# Patient Record
Sex: Female | Born: 1970 | Race: White | Hispanic: No | Marital: Married | Smoking: Never smoker
Health system: Southern US, Community
[De-identification: ages and names within clinical notes are randomized; demographics above are authoritative.]

## PROBLEM LIST (undated history)

## (undated) DIAGNOSIS — M5412 Radiculopathy, cervical region: Secondary | ICD-10-CM

## (undated) DIAGNOSIS — K219 Gastro-esophageal reflux disease without esophagitis: Secondary | ICD-10-CM

## (undated) DIAGNOSIS — Z9889 Other specified postprocedural states: Secondary | ICD-10-CM

## (undated) DIAGNOSIS — K589 Irritable bowel syndrome without diarrhea: Secondary | ICD-10-CM

## (undated) DIAGNOSIS — K429 Umbilical hernia without obstruction or gangrene: Secondary | ICD-10-CM

## (undated) DIAGNOSIS — E559 Vitamin D deficiency, unspecified: Secondary | ICD-10-CM

## (undated) DIAGNOSIS — G43909 Migraine, unspecified, not intractable, without status migrainosus: Secondary | ICD-10-CM

## (undated) DIAGNOSIS — M1990 Unspecified osteoarthritis, unspecified site: Secondary | ICD-10-CM

## (undated) DIAGNOSIS — R011 Cardiac murmur, unspecified: Secondary | ICD-10-CM

## (undated) DIAGNOSIS — R569 Unspecified convulsions: Secondary | ICD-10-CM

## (undated) DIAGNOSIS — M5136 Other intervertebral disc degeneration, lumbar region: Secondary | ICD-10-CM

## (undated) DIAGNOSIS — J45909 Unspecified asthma, uncomplicated: Secondary | ICD-10-CM

## (undated) DIAGNOSIS — M51369 Other intervertebral disc degeneration, lumbar region without mention of lumbar back pain or lower extremity pain: Secondary | ICD-10-CM

## (undated) DIAGNOSIS — G629 Polyneuropathy, unspecified: Secondary | ICD-10-CM

## (undated) DIAGNOSIS — Z973 Presence of spectacles and contact lenses: Secondary | ICD-10-CM

## (undated) DIAGNOSIS — E119 Type 2 diabetes mellitus without complications: Secondary | ICD-10-CM

## (undated) DIAGNOSIS — Z9289 Personal history of other medical treatment: Secondary | ICD-10-CM

## (undated) DIAGNOSIS — R112 Nausea with vomiting, unspecified: Secondary | ICD-10-CM

## (undated) DIAGNOSIS — G43109 Migraine with aura, not intractable, without status migrainosus: Secondary | ICD-10-CM

## (undated) DIAGNOSIS — R413 Other amnesia: Secondary | ICD-10-CM

## (undated) DIAGNOSIS — F419 Anxiety disorder, unspecified: Secondary | ICD-10-CM

## (undated) HISTORY — DX: Unspecified osteoarthritis, unspecified site: M19.90

## (undated) HISTORY — PX: LUMBAR DISC SURGERY: SHX700

## (undated) HISTORY — DX: Umbilical hernia without obstruction or gangrene: K42.9

## (undated) HISTORY — DX: Personal history of other medical treatment: Z92.89

## (undated) HISTORY — DX: Vitamin D deficiency, unspecified: E55.9

## (undated) HISTORY — PX: ARTHROSCOPIC REPAIR ACL: SUR80

## (undated) HISTORY — DX: Other intervertebral disc degeneration, lumbar region: M51.36

## (undated) HISTORY — DX: Radiculopathy, cervical region: M54.12

## (undated) HISTORY — PX: ABDOMINOPLASTY: SUR9

## (undated) HISTORY — DX: Unspecified asthma, uncomplicated: J45.909

## (undated) HISTORY — PX: OOPHORECTOMY: SHX86

## (undated) HISTORY — DX: Cardiac murmur, unspecified: R01.1

## (undated) HISTORY — DX: Unspecified convulsions: R56.9

## (undated) HISTORY — DX: Irritable bowel syndrome, unspecified: K58.9

## (undated) HISTORY — DX: Migraine with aura, not intractable, without status migrainosus: G43.109

## (undated) HISTORY — DX: Migraine, unspecified, not intractable, without status migrainosus: G43.909

## (undated) HISTORY — DX: Other intervertebral disc degeneration, lumbar region without mention of lumbar back pain or lower extremity pain: M51.369

---
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
## 1998-01-25 HISTORY — PX: SALPINGECTOMY: SHX328

## 1998-04-02 ENCOUNTER — Other Ambulatory Visit: Admission: RE | Admit: 1998-04-02 | Discharge: 1998-04-02 | Payer: Self-pay | Admitting: Obstetrics and Gynecology

## 1998-06-04 ENCOUNTER — Other Ambulatory Visit: Admission: RE | Admit: 1998-06-04 | Discharge: 1998-06-04 | Payer: Self-pay | Admitting: Obstetrics and Gynecology

## 1998-11-12 ENCOUNTER — Other Ambulatory Visit: Admission: RE | Admit: 1998-11-12 | Discharge: 1998-11-12 | Payer: Self-pay | Admitting: Obstetrics & Gynecology

## 1998-12-18 ENCOUNTER — Encounter: Payer: Self-pay | Admitting: Emergency Medicine

## 1998-12-18 ENCOUNTER — Emergency Department (HOSPITAL_COMMUNITY): Admission: EM | Admit: 1998-12-18 | Discharge: 1998-12-18 | Payer: Self-pay | Admitting: Emergency Medicine

## 1999-03-11 ENCOUNTER — Inpatient Hospital Stay (HOSPITAL_COMMUNITY): Admission: AD | Admit: 1999-03-11 | Discharge: 1999-03-13 | Payer: Self-pay | Admitting: Obstetrics and Gynecology

## 2001-01-25 HISTORY — PX: TUBAL LIGATION: SHX77

## 2001-03-01 ENCOUNTER — Other Ambulatory Visit: Admission: RE | Admit: 2001-03-01 | Discharge: 2001-03-01 | Payer: Self-pay | Admitting: Obstetrics and Gynecology

## 2001-10-02 ENCOUNTER — Inpatient Hospital Stay (HOSPITAL_COMMUNITY): Admission: AD | Admit: 2001-10-02 | Discharge: 2001-10-02 | Payer: Self-pay | Admitting: Obstetrics & Gynecology

## 2001-10-12 ENCOUNTER — Inpatient Hospital Stay (HOSPITAL_COMMUNITY): Admission: AD | Admit: 2001-10-12 | Discharge: 2001-10-12 | Payer: Self-pay | Admitting: Obstetrics and Gynecology

## 2001-10-25 ENCOUNTER — Inpatient Hospital Stay (HOSPITAL_COMMUNITY): Admission: AD | Admit: 2001-10-25 | Discharge: 2001-10-26 | Payer: Self-pay | Admitting: Obstetrics and Gynecology

## 2002-04-27 ENCOUNTER — Other Ambulatory Visit: Admission: RE | Admit: 2002-04-27 | Discharge: 2002-04-27 | Payer: Self-pay | Admitting: Obstetrics and Gynecology

## 2002-08-27 ENCOUNTER — Encounter: Admission: RE | Admit: 2002-08-27 | Discharge: 2002-09-26 | Payer: Self-pay | Admitting: Obstetrics and Gynecology

## 2002-08-30 ENCOUNTER — Emergency Department (HOSPITAL_COMMUNITY): Admission: EM | Admit: 2002-08-30 | Discharge: 2002-08-30 | Payer: Self-pay | Admitting: Emergency Medicine

## 2004-06-22 ENCOUNTER — Emergency Department (HOSPITAL_COMMUNITY): Admission: EM | Admit: 2004-06-22 | Discharge: 2004-06-22 | Payer: Self-pay | Admitting: Emergency Medicine

## 2005-02-26 ENCOUNTER — Other Ambulatory Visit: Admission: RE | Admit: 2005-02-26 | Discharge: 2005-02-26 | Payer: Self-pay | Admitting: Obstetrics and Gynecology

## 2009-01-25 DIAGNOSIS — M5412 Radiculopathy, cervical region: Secondary | ICD-10-CM

## 2009-01-25 HISTORY — DX: Radiculopathy, cervical region: M54.12

## 2011-03-09 ENCOUNTER — Encounter (INDEPENDENT_AMBULATORY_CARE_PROVIDER_SITE_OTHER): Payer: Self-pay | Admitting: Surgery

## 2011-03-12 ENCOUNTER — Encounter (INDEPENDENT_AMBULATORY_CARE_PROVIDER_SITE_OTHER): Payer: Self-pay | Admitting: Surgery

## 2011-03-12 ENCOUNTER — Ambulatory Visit (INDEPENDENT_AMBULATORY_CARE_PROVIDER_SITE_OTHER): Payer: Federal, State, Local not specified - PPO | Admitting: Surgery

## 2011-03-12 VITALS — BP 104/68 | HR 68 | Temp 97.8°F | Resp 18 | Ht 64.0 in | Wt 171.4 lb

## 2011-03-12 DIAGNOSIS — K429 Umbilical hernia without obstruction or gangrene: Secondary | ICD-10-CM

## 2011-03-12 NOTE — Patient Instructions (Signed)
Hernia °A hernia occurs when an internal organ pushes out through a weak spot in the abdominal wall. Hernias most commonly occur in the groin and around the navel. Hernias often can be pushed back into place (reduced). Most hernias tend to get worse over time. Some abdominal hernias can get stuck in the opening (irreducible or incarcerated hernia) and cannot be reduced. An irreducible abdominal hernia which is tightly squeezed into the opening is at risk for impaired blood supply (strangulated hernia). A strangulated hernia is a medical emergency. Because of the risk for an irreducible or strangulated hernia, surgery may be recommended to repair a hernia. °CAUSES  °· Heavy lifting.  °· Prolonged coughing.  °· Straining to have a bowel movement.  °· A cut (incision) made during an abdominal surgery.  °HOME CARE INSTRUCTIONS  °· Bed rest is not required. You may continue your normal activities.  °· Avoid lifting more than 10 pounds (4.5 kg) or straining.  °· Cough gently. If you are a smoker it is best to stop. Even the best hernia repair can break down with the continual strain of coughing. Even if you do not have your hernia repaired, a cough will continue to aggravate the problem.  °· Do not wear anything tight over your hernia. Do not try to keep it in with an outside bandage or truss. These can damage abdominal contents if they are trapped within the hernia sac.  °· Eat a normal diet.  °· Avoid constipation. Straining over long periods of time will increase hernia size and encourage breakdown of repairs. If you cannot do this with diet alone, stool softeners may be used.  °SEEK IMMEDIATE MEDICAL CARE IF:  °· You have a fever.  °· You develop increasing abdominal pain.  °· You feel nauseous or vomit.  °· Your hernia is stuck outside the abdomen, looks discolored, feels hard, or is tender.  °· You have any changes in your bowel habits or in the hernia that are unusual for you.  °· You have increased pain or  swelling around the hernia.  °· You cannot push the hernia back in place by applying gentle pressure while lying down.  °MAKE SURE YOU:  °· Understand these instructions.  °· Will watch your condition.  °· Will get help right away if you are not doing well or get worse.  °Document Released: 01/11/2005 Document Revised: 09/23/2010 Document Reviewed: 08/31/2007 °[employer]® Patient Information ©2012 [employer]. °

## 2011-03-12 NOTE — Progress Notes (Signed)
Patient ID: Alison Sampson, female   DOB: 12/04/1970, 40 y.o.   MRN: [id number]  Chief Complaint  Patient presents with  . Umbilical Hernia    HPI Alison Sampson is a 40 y.o. female.   HPIPatient presents due to an umbilical hernia. She's had 2003 after tubal ligation. It is causing mild discomfort. She is scheduled for liposuction and abdominoplasty in the next few months and would like to get this fixed. The discomfort is mild. She has no nausea or vomiting.  Past Medical History  Diagnosis Date  . Arthritis   . Umbilical hernia   . Hearing loss     Past Surgical History  Procedure Date  . Tubal ligation 2003  . Arthroscopic repair acl 2010, 2011    ACL Repair x 2  . Cosmetic surgery 1990    Breast Augmentation  . Breast surgery 04/1988    Breast Implants    History reviewed. No pertinent family history.  Social History History  Substance Use Topics  . Smoking status: Never Smoker   . Smokeless tobacco: Never Used  . Alcohol Use: No    Allergies  Allergen Reactions  . Tetracyclines & Related Nausea And Vomiting    No current outpatient prescriptions on file.    Review of Systems Review of Systems  Constitutional: Negative for fever, chills and unexpected weight change.  HENT: Negative for hearing loss, congestion, sore throat, trouble swallowing and voice change.   Eyes: Negative for visual disturbance.  Respiratory: Negative for cough and wheezing.   Cardiovascular: Negative for chest pain, palpitations and leg swelling.  Gastrointestinal: Negative for nausea, vomiting, abdominal pain, diarrhea, constipation, blood in stool, abdominal distention and anal bleeding.  Genitourinary: Negative for hematuria, vaginal bleeding and difficulty urinating.  Musculoskeletal: Negative for arthralgias.  Skin: Negative for rash and wound.  Neurological: Negative for seizures, syncope and headaches.  Hematological: Negative for adenopathy. Does not bruise/bleed easily.    Psychiatric/Behavioral: Negative for confusion.    Blood pressure 104/68, pulse 68, temperature 97.8 F (36.6 C), temperature source Temporal, resp. rate 18, height 5' 4" (1.626 m), weight 171 lb 6.4 oz (77.747 kg).  Physical Exam Physical Exam  Constitutional: She is oriented to person, place, and time. She appears well-developed and well-nourished.  HENT:  Head: Normocephalic and atraumatic.  Eyes: EOM are normal. Pupils are equal, round, and reactive to light.  Neck: Normal range of motion. Neck supple.  Cardiovascular: Normal rate and regular rhythm.   Pulmonary/Chest: Effort normal and breath sounds normal.  Abdominal:    Neurological: She is alert and oriented to person, place, and time. GCS eye subscore is 4. GCS verbal subscore is 5. GCS motor subscore is 6.  Skin: Skin is warm and dry.  Psychiatric: She has a normal mood and affect. Her speech is normal and behavior is normal. Judgment and thought content normal. Cognition and memory are normal.    Data Reviewed   Assessment   umbilical hernia    Plan    Repair  umbilical hernia. The risk of hernia repair include bleeding,  Infection,   Recurrence of the hernia,  Mesh use, chronic pain,  Organ injury,  Bowel injury,  Bladder injury,   nerve injury with numbness around the incision,  Death,  and worsening of preexisting  medical problems.  The alternatives to surgery have been discussed as well..  Long term expectations of both operative and non operative treatments have been discussed.   The patient agrees to proceed.         Mayrani Khamis A. 03/12/2011, 9:55 AM    

## 2011-03-17 ENCOUNTER — Encounter (HOSPITAL_BASED_OUTPATIENT_CLINIC_OR_DEPARTMENT_OTHER): Payer: Self-pay | Admitting: *Deleted

## 2011-03-17 ENCOUNTER — Encounter (HOSPITAL_BASED_OUTPATIENT_CLINIC_OR_DEPARTMENT_OTHER)
Admission: RE | Admit: 2011-03-17 | Discharge: 2011-03-17 | Disposition: A | Discharge status: Home / Self Care | Payer: Federal, State, Local not specified - PPO | Source: Ambulatory Visit | Attending: Surgery | Admitting: Surgery

## 2011-03-19 ENCOUNTER — Other Ambulatory Visit (INDEPENDENT_AMBULATORY_CARE_PROVIDER_SITE_OTHER): Payer: Self-pay | Admitting: Surgery

## 2011-03-22 ENCOUNTER — Ambulatory Visit
Admission: RE | Admit: 2011-03-22 | Discharge: 2011-03-22 | Disposition: A | Discharge status: Home / Self Care | Payer: Federal, State, Local not specified - PPO | Source: Ambulatory Visit | Attending: Surgery | Admitting: Surgery

## 2011-03-22 ENCOUNTER — Encounter (HOSPITAL_BASED_OUTPATIENT_CLINIC_OR_DEPARTMENT_OTHER)
Admission: RE | Admit: 2011-03-22 | Discharge: 2011-03-22 | Disposition: A | Discharge status: Home / Self Care | Payer: Federal, State, Local not specified - PPO | Source: Ambulatory Visit | Attending: Surgery | Admitting: Surgery

## 2011-03-22 LAB — DIFFERENTIAL
Eosinophils Relative: 1 % (ref 0–5)
Lymphocytes Relative: 26 % (ref 12–46)
Monocytes Absolute: 0.5 10*3/uL (ref 0.1–1.0)
Monocytes Relative: 6 % (ref 3–12)
Neutro Abs: 6.4 10*3/uL (ref 1.7–7.7)

## 2011-03-22 LAB — COMPREHENSIVE METABOLIC PANEL
ALT: 13 U/L (ref 0–35)
AST: 17 U/L (ref 0–37)
Albumin: 3.9 g/dL (ref 3.5–5.2)
CO2: 27 mEq/L (ref 19–32)
Calcium: 9.7 mg/dL (ref 8.4–10.5)
Chloride: 102 mEq/L (ref 96–112)
Creatinine, Ser: 0.86 mg/dL (ref 0.50–1.10)
GFR calc non Af Amer: 83 mL/min — ABNORMAL LOW (ref >90)
Sodium: 138 mEq/L (ref 135–145)
Total Bilirubin: 0.1 mg/dL — ABNORMAL LOW (ref 0.3–1.2)

## 2011-03-22 LAB — CBC
MCV: 83.3 fL (ref 78.0–100.0)
Platelets: 304 10*3/uL (ref 150–400)
RBC: 4.48 MIL/uL (ref 3.87–5.11)
RDW: 12.2 % (ref 11.5–15.5)
WBC: 9.4 10*3/uL (ref 4.0–10.5)

## 2011-03-24 ENCOUNTER — Encounter (HOSPITAL_BASED_OUTPATIENT_CLINIC_OR_DEPARTMENT_OTHER): Payer: Self-pay | Admitting: Anesthesiology

## 2011-03-24 ENCOUNTER — Ambulatory Visit (HOSPITAL_BASED_OUTPATIENT_CLINIC_OR_DEPARTMENT_OTHER)
Admission: RE | Admit: 2011-03-24 | Discharge: 2011-03-24 | Disposition: A | Discharge status: Home / Self Care | Payer: Federal, State, Local not specified - PPO | Source: Ambulatory Visit | Attending: Surgery | Admitting: Surgery

## 2011-03-24 ENCOUNTER — Encounter (HOSPITAL_BASED_OUTPATIENT_CLINIC_OR_DEPARTMENT_OTHER): Payer: Self-pay | Admitting: *Deleted

## 2011-03-24 ENCOUNTER — Ambulatory Visit (HOSPITAL_BASED_OUTPATIENT_CLINIC_OR_DEPARTMENT_OTHER): Payer: Federal, State, Local not specified - PPO | Admitting: Anesthesiology

## 2011-03-24 ENCOUNTER — Encounter (HOSPITAL_BASED_OUTPATIENT_CLINIC_OR_DEPARTMENT_OTHER)
Admission: RE | Disposition: A | Discharge status: Home / Self Care | Payer: Self-pay | Source: Ambulatory Visit | Attending: Surgery

## 2011-03-24 ENCOUNTER — Encounter (HOSPITAL_BASED_OUTPATIENT_CLINIC_OR_DEPARTMENT_OTHER): Payer: Self-pay | Admitting: Surgery

## 2011-03-24 DIAGNOSIS — Z01812 Encounter for preprocedural laboratory examination: Secondary | ICD-10-CM | POA: Insufficient documentation

## 2011-03-24 DIAGNOSIS — K429 Umbilical hernia without obstruction or gangrene: Secondary | ICD-10-CM

## 2011-03-24 DIAGNOSIS — K219 Gastro-esophageal reflux disease without esophagitis: Secondary | ICD-10-CM | POA: Insufficient documentation

## 2011-03-24 HISTORY — PX: UMBILICAL HERNIA REPAIR: SHX196

## 2011-03-24 HISTORY — DX: Other specified postprocedural states: Z98.890

## 2011-03-24 HISTORY — DX: Gastro-esophageal reflux disease without esophagitis: K21.9

## 2011-03-24 HISTORY — DX: Nausea with vomiting, unspecified: R11.2

## 2011-03-24 SURGERY — REPAIR, HERNIA, UMBILICAL, ADULT
Anesthesia: General | Site: Abdomen | Wound class: Clean

## 2011-03-24 MED ORDER — CEFAZOLIN SODIUM 1-5 GM-% IV SOLN
1.0000 g | INTRAVENOUS | Status: AC
  Administered 2011-03-24: 1 g via INTRAVENOUS

## 2011-03-24 MED ORDER — FENTANYL CITRATE 0.05 MG/ML IJ SOLN
INTRAMUSCULAR | Status: DC | PRN
  Administered 2011-03-24: 100 ug via INTRAVENOUS

## 2011-03-24 MED ORDER — METOCLOPRAMIDE HCL 5 MG/ML IJ SOLN: 10.0000 mg | Freq: Once | INTRAMUSCULAR | Status: DC | PRN

## 2011-03-24 MED ORDER — HYDROCODONE-ACETAMINOPHEN 5-325 MG PO TABS: 1.0000 | ORAL_TABLET | Freq: Four times a day (QID) | ORAL | Status: AC | PRN

## 2011-03-24 MED ORDER — LACTATED RINGERS IV SOLN
INTRAVENOUS | Status: DC
  Administered 2011-03-24: 10:00:00 via INTRAVENOUS
  Administered 2011-03-24: 20 mL via INTRAVENOUS

## 2011-03-24 MED ORDER — PROPOFOL 10 MG/ML IV EMUL
INTRAVENOUS | Status: DC | PRN
  Administered 2011-03-24: 250 mg via INTRAVENOUS

## 2011-03-24 MED ORDER — BUPIVACAINE-EPINEPHRINE 0.25% -1:200000 IJ SOLN
INTRAMUSCULAR | Status: DC | PRN
  Administered 2011-03-24: 10 mL

## 2011-03-24 MED ORDER — SUCCINYLCHOLINE CHLORIDE 20 MG/ML IJ SOLN
INTRAMUSCULAR | Status: DC | PRN
  Administered 2011-03-24: 120 mg via INTRAVENOUS

## 2011-03-24 MED ORDER — MIDAZOLAM HCL 5 MG/5ML IJ SOLN
INTRAMUSCULAR | Status: DC | PRN
  Administered 2011-03-24: 2 mg via INTRAVENOUS

## 2011-03-24 MED ORDER — ONDANSETRON HCL 4 MG/2ML IJ SOLN
INTRAMUSCULAR | Status: DC | PRN
  Administered 2011-03-24: 4 mg via INTRAVENOUS

## 2011-03-24 MED ORDER — ACETAMINOPHEN 10 MG/ML IV SOLN
1000.0000 mg | Freq: Once | INTRAVENOUS | Status: AC
  Administered 2011-03-24: 1000 mg via INTRAVENOUS

## 2011-03-24 MED ORDER — MORPHINE SULFATE 10 MG/ML IJ SOLN: 0.0500 mg/kg | INTRAMUSCULAR | Status: DC | PRN

## 2011-03-24 MED ORDER — METOCLOPRAMIDE HCL 5 MG/ML IJ SOLN
INTRAMUSCULAR | Status: DC | PRN
  Administered 2011-03-24: 10 mg via INTRAVENOUS

## 2011-03-24 MED ORDER — DEXAMETHASONE SODIUM PHOSPHATE 4 MG/ML IJ SOLN
INTRAMUSCULAR | Status: DC | PRN
  Administered 2011-03-24: 6 mg via INTRAVENOUS

## 2011-03-24 MED ORDER — MIDAZOLAM HCL 2 MG/2ML IJ SOLN: 0.5000 mg | INTRAMUSCULAR | Status: DC | PRN

## 2011-03-24 MED ORDER — HYDROMORPHONE HCL PF 1 MG/ML IJ SOLN: 0.2500 mg | INTRAMUSCULAR | Status: DC | PRN

## 2011-03-24 MED ORDER — LIDOCAINE HCL (CARDIAC) 20 MG/ML IV SOLN
INTRAVENOUS | Status: DC | PRN
  Administered 2011-03-24: 60 mg via INTRAVENOUS

## 2011-03-24 SURGICAL SUPPLY — 55 items
ADH SKN CLS APL DERMABOND .7 (GAUZE/BANDAGES/DRESSINGS) ×2
APL SKNCLS STERI-STRIP NONHPOA (GAUZE/BANDAGES/DRESSINGS)
BENZOIN TINCTURE PRP APPL 2/3 (GAUZE/BANDAGES/DRESSINGS) IMPLANT
BLADE SURG 10 STRL SS (BLADE) ×3 IMPLANT
BLADE SURG 15 STRL LF DISP TIS (BLADE) ×2 IMPLANT
BLADE SURG 15 STRL SS (BLADE) ×3
BLADE SURG ROTATE 9660 (MISCELLANEOUS) IMPLANT
CANISTER SUCTION 1200CC (MISCELLANEOUS) ×3 IMPLANT
CHLORAPREP W/TINT 26ML (MISCELLANEOUS) ×3 IMPLANT
CLEANER CAUTERY TIP 5X5 PAD (MISCELLANEOUS) ×2 IMPLANT
CLOTH BEACON ORANGE TIMEOUT ST (SAFETY) ×3 IMPLANT
COVER MAYO STAND STRL (DRAPES) ×3 IMPLANT
COVER TABLE BACK 60X90 (DRAPES) ×3 IMPLANT
DECANTER SPIKE VIAL GLASS SM (MISCELLANEOUS) IMPLANT
DERMABOND ADVANCED (GAUZE/BANDAGES/DRESSINGS) ×1
DERMABOND ADVANCED .7 DNX12 (GAUZE/BANDAGES/DRESSINGS) ×2 IMPLANT
DRAPE LAPAROTOMY TRNSV 102X78 (DRAPE) ×3 IMPLANT
DRAPE UTILITY XL STRL (DRAPES) ×3 IMPLANT
DRSG TEGADERM 4X4.75 (GAUZE/BANDAGES/DRESSINGS) IMPLANT
ELECT REM PT RETURN 9FT ADLT (ELECTROSURGICAL) ×3
ELECTRODE REM PT RTRN 9FT ADLT (ELECTROSURGICAL) ×2 IMPLANT
GLOVE BIOGEL PI IND STRL 7.0 (GLOVE) ×1 IMPLANT
GLOVE BIOGEL PI IND STRL 8 (GLOVE) ×3 IMPLANT
GLOVE BIOGEL PI INDICATOR 7.0 (GLOVE) ×1
GLOVE BIOGEL PI INDICATOR 8 (GLOVE) ×2
GLOVE ECLIPSE 8.0 STRL XLNG CF (GLOVE) ×3 IMPLANT
GLOVE SURG SS PI 7.5 STRL IVOR (GLOVE) ×2 IMPLANT
GOWN PREVENTION PLUS XLARGE (GOWN DISPOSABLE) ×5 IMPLANT
GOWN PREVENTION PLUS XXLARGE (GOWN DISPOSABLE) ×3 IMPLANT
NDL HYPO 25X1 1.5 SAFETY (NEEDLE) ×1 IMPLANT
NEEDLE HYPO 22GX1.5 SAFETY (NEEDLE) IMPLANT
NEEDLE HYPO 25X1 1.5 SAFETY (NEEDLE) ×3 IMPLANT
NS IRRIG 1000ML POUR BTL (IV SOLUTION) ×2 IMPLANT
PACK BASIN DAY SURGERY FS (CUSTOM PROCEDURE TRAY) ×3 IMPLANT
PAD CLEANER CAUTERY TIP 5X5 (MISCELLANEOUS) ×1
PENCIL BUTTON HOLSTER BLD 10FT (ELECTRODE) ×3 IMPLANT
SLEEVE SCD COMPRESS KNEE MED (MISCELLANEOUS) ×3 IMPLANT
STAPLER VISISTAT 35W (STAPLE) IMPLANT
STRIP CLOSURE SKIN 1/2X4 (GAUZE/BANDAGES/DRESSINGS) IMPLANT
SUT MON AB 4-0 PC3 18 (SUTURE) ×3 IMPLANT
SUT NOVA NAB DX-16 0-1 5-0 T12 (SUTURE) ×2 IMPLANT
SUT NOVA NAB GS-22 2 0 T19 (SUTURE) IMPLANT
SUT PROLENE 0 CT 1 30 (SUTURE) IMPLANT
SUT SILK 3 0 SH 30 (SUTURE) IMPLANT
SUT VIC AB 2-0 SH 27 (SUTURE) ×3
SUT VIC AB 2-0 SH 27XBRD (SUTURE) ×2 IMPLANT
SUT VIC AB 3-0 SH 27 (SUTURE) ×3
SUT VIC AB 3-0 SH 27X BRD (SUTURE) ×1 IMPLANT
SYR 3ML 18GX1 1/2 (SYRINGE) ×2 IMPLANT
SYR CONTROL 10ML LL (SYRINGE) ×3 IMPLANT
TOWEL OR 17X24 6PK STRL BLUE (TOWEL DISPOSABLE) ×3 IMPLANT
TOWEL OR NON WOVEN STRL DISP B (DISPOSABLE) ×3 IMPLANT
TUBE CONNECTING 20X1/4 (TUBING) ×3 IMPLANT
WATER STERILE IRR 1000ML POUR (IV SOLUTION) ×1 IMPLANT
YANKAUER SUCT BULB TIP NO VENT (SUCTIONS) ×3 IMPLANT

## 2011-03-24 NOTE — Anesthesia Preprocedure Evaluation (Signed)
Anesthesia Evaluation  Patient identified by MRN, date of birth, ID band Patient awake    Reviewed: Allergy & Precautions, H&P , NPO status , Patient's Chart, lab work & pertinent test results, reviewed documented beta blocker date and time   History of Anesthesia Complications (+) PONV  Airway Mallampati: II TM Distance: >3 FB Neck ROM: full    Dental   Pulmonary neg pulmonary ROS,          Cardiovascular neg cardio ROS     Neuro/Psych Negative Neurological ROS  Negative Psych ROS   GI/Hepatic Neg liver ROS, GERD-  Medicated and Controlled,  Endo/Other  Negative Endocrine ROS  Renal/GU negative Renal ROS  Genitourinary negative   Musculoskeletal   Abdominal   Peds  Hematology negative hematology ROS (+)   Anesthesia Other Findings See surgeon's H&P   Reproductive/Obstetrics negative OB ROS                           Anesthesia Physical Anesthesia Plan  ASA: II  Anesthesia Plan: General   Post-op Pain Management:    Induction: Intravenous  Airway Management Planned: LMA  Additional Equipment:   Intra-op Plan:   Post-operative Plan: Extubation in OR  Informed Consent: I have reviewed the patients History and Physical, chart, labs and discussed the procedure including the risks, benefits and alternatives for the proposed anesthesia with the patient or authorized representative who has indicated his/her understanding and acceptance.     Plan Discussed with: CRNA and Surgeon  Anesthesia Plan Comments:         Anesthesia Quick Evaluation  

## 2011-03-24 NOTE — Anesthesia Postprocedure Evaluation (Signed)
Anesthesia Post Note  Patient: Alison Sampson  Procedure(s) Performed: Procedure(s) (LRB): HERNIA REPAIR UMBILICAL ADULT (N/A)  Anesthesia type: General  Patient location: PACU  Post pain: Pain level controlled  Post assessment: Patient's Cardiovascular Status Stable  Last Vitals:  Filed Vitals:   03/24/11 1215  BP: 119/68  Pulse: 104  Temp:   Resp: 14    Post vital signs: Reviewed and stable  Level of consciousness: alert  Complications: No apparent anesthesia complications

## 2011-03-24 NOTE — Op Note (Signed)
Preop diagnosis: Umbilical hernia  Postop diagnosis: Same  Procedure: Umbilical hernia repair  Surgeon: Harriette Bouillon M.D.  Anesthesia: Gen. with 0.25% Sensorcaine with epinephrine  EBL: Less than 10 cc  Specimen: None  Drains: None  IV fluids: 1000 cc crystalloid  Indications for procedure: The patient is a 41 year old female the small umbilical hernia is causing discomfort. She wishes to have it repaired. We discussed surgical options and non-surgical options.The risk of hernia repair include bleeding,  Infection,   Recurrence of the hernia,  Mesh use, chronic pain,  Organ injury,  Bowel injury,  Bladder injury,   nerve injury with numbness around the incision,  Death,  and worsening of preexisting  medical problems.  The alternatives to surgery have been discussed as well..  Long term expectations of both operative and non operative treatments have been discussed.   The patient agrees to proceed.  Description of procedure: The patient was met in the holding area and questions were answered. She was brought back to the operating room and placed supine. After induction of general anesthesia, the abdomen was prepped and draped in a sterile fashion. Timeout was done. Local anesthesia was injected around the umbilicus. Curvilinear incision was made along the inferior aspect of the umbilicus. Dissection was carried down and the umbilicus was dissected off the hernia sac. A 1 cm hernia was identified. The pre-peritoneal fat was placed back in the peritoneal cavity the defect was closed with #1 Novafil suture. Balloon was irrigated. The umbilicus was attached to the fascia with 3-0 Vicryl. 4 Monocryl was used to close the skin a subcuticular fashion. Dermabond was applied. All final counts sponge, needles and instruments are found to be correct. The patient was awoke taken to recovery in satisfactory condition.

## 2011-03-24 NOTE — H&P (View-Only) (Signed)
Patient ID: Alison Sampson, female   DOB: [birth year], 41 y.o.   MRN: [id number]  Chief Complaint  Patient presents with  . Umbilical Hernia    HPI Alison Sampson is Sampson 41 y.o. female.   HPIPatient presents due to an umbilical hernia. She's had 2003 after tubal ligation. It is causing mild discomfort. She is scheduled for liposuction and abdominoplasty in the next few months and would like to get this fixed. The discomfort is mild. She has no nausea or vomiting.  Past Medical History  Diagnosis Date  . Arthritis   . Umbilical hernia   . Hearing loss     Past Surgical History  Procedure Date  . Tubal ligation 2003  . Arthroscopic repair acl 2010, 2011    ACL Repair x 2  . Cosmetic surgery 1990    Breast Augmentation  . Breast surgery 04/1988    Breast Implants    History reviewed. No pertinent family history.  Social History History  Substance Use Topics  . Smoking status: Never Smoker   . Smokeless tobacco: Never Used  . Alcohol Use: No    Allergies  Allergen Reactions  . Tetracyclines & Related Nausea And Vomiting    No current outpatient prescriptions on file.    Review of Systems Review of Systems  Constitutional: Negative for fever, chills and unexpected weight change.  HENT: Negative for hearing loss, congestion, sore throat, trouble swallowing and voice change.   Eyes: Negative for visual disturbance.  Respiratory: Negative for cough and wheezing.   Cardiovascular: Negative for chest pain, palpitations and leg swelling.  Gastrointestinal: Negative for nausea, vomiting, abdominal pain, diarrhea, constipation, blood in stool, abdominal distention and anal bleeding.  Genitourinary: Negative for hematuria, vaginal bleeding and difficulty urinating.  Musculoskeletal: Negative for arthralgias.  Skin: Negative for rash and wound.  Neurological: Negative for seizures, syncope and headaches.  Hematological: Negative for adenopathy. Does not bruise/bleed easily.    Psychiatric/Behavioral: Negative for confusion.    Blood pressure 104/68, pulse 68, temperature 97.8 F (36.6 C), temperature source Temporal, resp. rate 18, height 5\' 4"  (1.626 m), weight 171 lb 6.4 oz (77.747 kg).  Physical Exam Physical Exam  Constitutional: She is oriented to person, place, and time. She appears well-developed and well-nourished.  HENT:  Head: Normocephalic and atraumatic.  Eyes: EOM are normal. Pupils are equal, round, and reactive to light.  Neck: Normal range of motion. Neck supple.  Cardiovascular: Normal rate and regular rhythm.   Pulmonary/Chest: Effort normal and breath sounds normal.  Abdominal:    Neurological: She is alert and oriented to person, place, and time. GCS eye subscore is 4. GCS verbal subscore is 5. GCS motor subscore is 6.  Skin: Skin is warm and dry.  Psychiatric: She has Sampson normal mood and affect. Her speech is normal and behavior is normal. Judgment and thought content normal. Cognition and memory are normal.    Data Reviewed   Assessment   umbilical hernia    Plan    Repair  umbilical hernia. The risk of hernia repair include bleeding,  Infection,   Recurrence of the hernia,  Mesh use, chronic pain,  Organ injury,  Bowel injury,  Bladder injury,   nerve injury with numbness around the incision,  Death,  and worsening of preexisting  medical problems.  The alternatives to surgery have been discussed as well..  Long term expectations of both operative and non operative treatments have been discussed.   The patient agrees to proceed.  Alison Sampson. 03/12/2011, 9:55 AM

## 2011-03-24 NOTE — Discharge Instructions (Signed)
[facility] _____[facility]  UMBILICAL OR INGUINAL HERNIA REPAIR: POST OP INSTRUCTIONS  Always review your discharge instruction sheet given to you by the facility where your surgery was performed. IF YOU HAVE DISABILITY OR FAMILY LEAVE FORMS, YOU MUST BRING THEM TO THE OFFICE FOR PROCESSING.   DO NOT GIVE THEM TO YOUR DOCTOR.  1. A  prescription for pain medication may be given to you upon discharge.  Take your pain medication as prescribed, if needed.  If narcotic pain medicine is not needed, then you may take acetaminophen (Tylenol) or ibuprofen (Advil) as needed. 2. Take your usually prescribed medications unless otherwise directed. 3. If you need a refill on your pain medication, please contact your pharmacy.  They will contact our office to request authorization. Prescriptions will not be filled after 5 pm or on week-ends. 4. You should follow a light diet the first 24 hours after arrival home, such as soup and crackers, etc.  Be sure to include lots of fluids daily.  Resume your normal diet the day after surgery. 5. Most patients will experience some swelling and bruising around the umbilicus or in the groin and scrotum.  Ice packs and reclining will help.  Swelling and bruising can take several days to resolve.  6. It is common to experience some constipation if taking pain medication after surgery.  Increasing fluid intake and taking a stool softener (such as Colace) will usually help or prevent this problem from occurring.  A mild laxative (Milk of Magnesia or Miralax) should be taken according to package directions if there are no bowel movements after 48 hours. 7. Unless discharge instructions indicate otherwise, you may remove your bandages 24-48 hours after surgery, and you may shower at that time.  You may have steri-strips (small skin tapes) in place directly over the incision.  These strips should be left on the skin for 7-10 days.  If your surgeon used skin glue on the  incision, you may shower in 24 hours.  The glue will flake off over the next 2-3 weeks.  Any sutures or staples will be removed at the office during your follow-up visit. 8. ACTIVITIES:  You may resume regular (light) daily activities beginning the next day--such as daily self-care, walking, climbing stairs--gradually increasing activities as tolerated.  You may have sexual intercourse when it is comfortable.  Refrain from any heavy lifting or straining until approved by your doctor. a. You may drive when you are no longer taking prescription pain medication, you can comfortably wear a seatbelt, and you can safely maneuver your car and apply brakes. b. RETURN TO WORK:  __________________________________________________________ 9. You should see your doctor in the office for a follow-up appointment approximately 2-3 weeks after your surgery.  Make sure that you call for this appointment within a day or two after you arrive home to insure a convenient appointment time. 10. OTHER INSTRUCTIONS:  __________________________________________________________________________________________________________________________________________________________________________________________  WHEN TO CALL YOUR DOCTOR: 1. Fever over 101.0 2. Inability to urinate 3. Nausea and/or vomiting 4. Extreme swelling or bruising 5. Continued bleeding from incision. 6. Increased pain, redness, or drainage from the incision  The clinic staff is available to answer your questions during regular business hours.  Please don't hesitate to call and ask to speak to one of the nurses for clinical concerns.  If you have a medical emergency, go to the nearest emergency room or call 911.  A surgeon from [facility] is always on call at the hospital     [address], [place], [place]  [address] ?  [address] [address], [place], [place]   [address] [phone] ? [phone] ? FAX [phone] Web site:  [email or web address]   [facility]  [address] [place], [place] [address] [phone]   Post Anesthesia Home Care Instructions  Activity: Get plenty of rest for the remainder of the day. A responsible adult should stay with you for 24 hours following the procedure.  For the next 24 hours, DO NOT: -Drive a car -Advertising copywriter -Drink alcoholic beverages -Take any medication unless instructed by your physician -Make any legal decisions or sign important papers.  Meals: Start with liquid foods such as gelatin or soup. Progress to regular foods as tolerated. Avoid greasy, spicy, heavy foods. If nausea and/or vomiting occur, drink only clear liquids until the nausea and/or vomiting subsides. Call your physician if vomiting continues.  Special Instructions/Symptoms: Your throat may feel dry or sore from the anesthesia or the breathing tube placed in your throat during surgery. If this causes discomfort, gargle with warm salt water. The discomfort should disappear within 24 hours.

## 2011-03-24 NOTE — Anesthesia Procedure Notes (Signed)
Procedure Name: Intubation Date/Time: 03/24/2011 10:51 AM Performed by: Radford Pax Pre-anesthesia Checklist: Patient identified, Emergency Drugs available, Suction available, Patient being monitored and Timeout performed Patient Re-evaluated:Patient Re-evaluated prior to inductionOxygen Delivery Method: Circle System Utilized Preoxygenation: Pre-oxygenation with 100% oxygen Intubation Type: IV induction Ventilation: Mask ventilation without difficulty Laryngoscope Size: Miller and 2 Grade View: Grade I Tube type: Oral Tube size: 7.0 mm Number of attempts: 1 (cords open and clear, atraumatic) Airway Equipment and Method: oral airway Placement Confirmation: ETT inserted through vocal cords under direct vision,  positive ETCO2 and breath sounds checked- equal and bilateral Tube secured with: Tape (pink tape used) Dental Injury: Teeth and Oropharynx as per pre-operative assessment

## 2011-03-24 NOTE — Transfer of Care (Signed)
Immediate Anesthesia Transfer of Care Note  Patient: Alison Sampson  Procedure(s) Performed: Procedure(s) (LRB): HERNIA REPAIR UMBILICAL ADULT (N/A)  Patient Location: PACU  Anesthesia Type: General  Level of Consciousness: awake, alert , oriented and patient cooperative  Airway & Oxygen Therapy: Patient Spontanous Breathing and Patient connected to face mask oxygen  Post-op Assessment: Report given to PACU RN and Post -op Vital signs reviewed and stable  Post vital signs: Reviewed and stable  Complications: No apparent anesthesia complications

## 2011-03-24 NOTE — Interval H&P Note (Signed)
History and Physical Interval Note:  03/24/2011 10:31 AM  Alison Sampson  has presented today for surgery, with the diagnosis of umbilical hernia  The various methods of treatment have been discussed with the patient and family. After consideration of risks, benefits and other options for treatment, the patient has consented to  Procedure(s) (LRB): HERNIA REPAIR UMBILICAL ADULT (N/A) INSERTION OF MESH (N/A) as a surgical intervention .  The patients' history has been reviewed, patient examined, no change in status, stable for surgery.  I have reviewed the patients' chart and labs.  Questions were answered to the patient's satisfaction.     [name]

## 2011-03-25 ENCOUNTER — Encounter (HOSPITAL_BASED_OUTPATIENT_CLINIC_OR_DEPARTMENT_OTHER): Payer: Self-pay | Admitting: Surgery

## 2011-04-15 ENCOUNTER — Encounter (INDEPENDENT_AMBULATORY_CARE_PROVIDER_SITE_OTHER): Payer: Self-pay | Admitting: Surgery

## 2011-04-15 ENCOUNTER — Encounter (INDEPENDENT_AMBULATORY_CARE_PROVIDER_SITE_OTHER): Payer: Federal, State, Local not specified - PPO | Admitting: Surgery

## 2011-04-15 ENCOUNTER — Ambulatory Visit (INDEPENDENT_AMBULATORY_CARE_PROVIDER_SITE_OTHER): Payer: Federal, State, Local not specified - PPO | Admitting: Surgery

## 2011-04-15 VITALS — BP 118/78 | HR 60 | Temp 97.6°F | Resp 12 | Ht 64.0 in | Wt 169.8 lb

## 2011-04-15 DIAGNOSIS — Z9889 Other specified postprocedural states: Secondary | ICD-10-CM

## 2011-04-15 NOTE — Progress Notes (Signed)
Patient returns after umbilical hernia repair. She is doing well.  Exam: Umbilical incision healing well. No signs of infection.  Impression status post repair of the hernia without mesh  Plan: Return to full activity. Follow up as needed. I proceeded with plastic surgery as planned.

## 2011-04-15 NOTE — Patient Instructions (Signed)
Return to full activity.  May proceed with plastic surgery.

## 2012-01-26 DIAGNOSIS — I469 Cardiac arrest, cause unspecified: Secondary | ICD-10-CM

## 2012-01-26 HISTORY — PX: REDUCTION MAMMAPLASTY: SUR839

## 2012-01-26 HISTORY — PX: ABDOMINOPLASTY: SUR9

## 2012-01-26 HISTORY — DX: Cardiac arrest, cause unspecified: I46.9

## 2012-06-07 ENCOUNTER — Other Ambulatory Visit: Payer: Self-pay | Admitting: Dermatology

## 2012-08-22 ENCOUNTER — Telehealth: Payer: Self-pay | Admitting: Neurology

## 2012-08-22 NOTE — Telephone Encounter (Signed)
Please assign patient, former [facility] patient to be scheduled. Last seen 02-16-10.

## 2012-08-22 NOTE — Telephone Encounter (Signed)
Sent to scheduler.  

## 2012-12-14 ENCOUNTER — Ambulatory Visit: Payer: Self-pay | Admitting: Neurology

## 2013-10-11 DIAGNOSIS — F419 Anxiety disorder, unspecified: Secondary | ICD-10-CM | POA: Insufficient documentation

## 2013-10-11 DIAGNOSIS — F329 Major depressive disorder, single episode, unspecified: Secondary | ICD-10-CM | POA: Insufficient documentation

## 2013-10-11 DIAGNOSIS — F32A Depression, unspecified: Secondary | ICD-10-CM | POA: Insufficient documentation

## 2013-12-14 ENCOUNTER — Other Ambulatory Visit: Payer: Self-pay | Admitting: Rehabilitation

## 2013-12-14 DIAGNOSIS — M5412 Radiculopathy, cervical region: Secondary | ICD-10-CM

## 2013-12-23 ENCOUNTER — Ambulatory Visit
Admission: RE | Admit: 2013-12-23 | Discharge: 2013-12-23 | Disposition: A | Discharge status: Home / Self Care | Payer: Federal, State, Local not specified - PPO | Source: Ambulatory Visit | Attending: Rehabilitation | Admitting: Rehabilitation

## 2013-12-23 DIAGNOSIS — M5412 Radiculopathy, cervical region: Secondary | ICD-10-CM

## 2014-01-21 ENCOUNTER — Other Ambulatory Visit: Payer: Self-pay | Admitting: Rehabilitation

## 2014-01-21 DIAGNOSIS — M7918 Myalgia, other site: Secondary | ICD-10-CM | POA: Insufficient documentation

## 2014-01-21 DIAGNOSIS — G930 Cerebral cysts: Secondary | ICD-10-CM

## 2014-01-27 ENCOUNTER — Inpatient Hospital Stay: Admission: RE | Admit: 2014-01-27 | Payer: Federal, State, Local not specified - PPO | Source: Ambulatory Visit

## 2014-02-03 ENCOUNTER — Ambulatory Visit
Admission: RE | Admit: 2014-02-03 | Discharge: 2014-02-03 | Disposition: A | Discharge status: Home / Self Care | Payer: Federal, State, Local not specified - PPO | Source: Ambulatory Visit | Attending: Rehabilitation | Admitting: Rehabilitation

## 2014-02-03 DIAGNOSIS — G930 Cerebral cysts: Secondary | ICD-10-CM

## 2014-02-03 MED ORDER — GADOBENATE DIMEGLUMINE 529 MG/ML IV SOLN
13.0000 mL | Freq: Once | INTRAVENOUS | Status: AC | PRN
  Administered 2014-02-03: 13 mL via INTRAVENOUS

## 2014-05-09 ENCOUNTER — Ambulatory Visit (INDEPENDENT_AMBULATORY_CARE_PROVIDER_SITE_OTHER): Payer: Federal, State, Local not specified - PPO | Admitting: Neurology

## 2014-05-09 ENCOUNTER — Encounter: Payer: Self-pay | Admitting: Neurology

## 2014-05-09 VITALS — BP 109/76 | HR 67 | Ht 64.0 in | Wt 144.2 lb

## 2014-05-09 DIAGNOSIS — G43109 Migraine with aura, not intractable, without status migrainosus: Secondary | ICD-10-CM | POA: Diagnosis not present

## 2014-05-09 HISTORY — DX: Migraine with aura, not intractable, without status migrainosus: G43.109

## 2014-05-09 MED ORDER — TOPIRAMATE 25 MG PO TABS: ORAL_TABLET | ORAL | Status: DC

## 2014-05-09 MED ORDER — NARATRIPTAN HCL 2.5 MG PO TABS: 2.5000 mg | ORAL_TABLET | Freq: Two times a day (BID) | ORAL | Status: DC | PRN

## 2014-05-09 NOTE — Progress Notes (Signed)
Reason for visit: Migraine headache  Referring physician: No primary care physician  Alison Sampson is a 44 y.o. female  History of present illness:  Alison Sampson is a 44 year old right-handed white female with a history of migraine headaches. The patient was seen over 4 years ago by Dr. Erling Cruz with a two-year history at that time of headache. The patient indicates that the headaches include the entire head, associated with a throbbing sensation. The patient may develop some left-sided facial numbness, dizziness, and some nausea. The patient feels weak all over with the headache. She indicates that the headaches have become more frequent over time, and she is now having 10-15 headache days a month. At least 4 days of the month she is missing work. The patient works out of the home, but she finds that she is completely unable to function at times. She reports lights in the vision, with a tunnel vision associated with the headache. The patient may have some neck discomfort, but she has chronic cervical spondylosis. She has found that excessive caffeine intake and alcohol may bring on the headache. A disrupted sleep schedule, and certain odors such as perfumes may bring on the headache. The headache may occur just prior to the onset of her menstrual cycle. She has been on low-dose Topamax taking 25 mg at night. She has noted that hydrocodone will worsen the headache at times. She denies any family history of headache but she does not know much about her mother's side of the family. The headaches last 2-4 days. The patient does report photophobia and phonophobia with the headache.  Past Medical History  Diagnosis Date  . Arthritis   . Umbilical hernia   . Hearing loss   . PONV (postoperative nausea and vomiting)   . GERD (gastroesophageal reflux disease)   . Classic migraine 05/09/2014    Past Surgical History  Procedure Laterality Date  . Tubal ligation  2003  . Arthroscopic repair acl  2010, 2011      ACL Repair x 2  . Cosmetic surgery  1990    Breast Augmentation  . Breast surgery  04/1988    Breast Implants  . Back surgery    . Diagnostic laparoscopy      tubal ligation  . Umbilical hernia repair  03/24/2011    Procedure: HERNIA REPAIR UMBILICAL ADULT;  Surgeon: Joyice Faster. Cornett, MD;  Location: [facility];  Service: General;  Laterality: N/A;  umbilicus    Family History  Problem Relation Age of Onset  . Cancer Maternal Grandmother     breast and ovarian  . Heart disease Maternal Grandmother   . Cancer Paternal Grandmother     lung  . Healthy Brother   . Migraines Neg Hx     Social history:  reports that she has never smoked. She has never used smokeless tobacco. She reports that she does not drink alcohol or use illicit drugs.  Medications:  Prior to Admission medications   Medication Sig Start Date End Date Taking? Authorizing Provider  carisoprodol (SOMA) 350 MG tablet Take 350 mg by mouth daily as needed. 01/11/14  Yes Historical Provider, MD  topiramate (TOPAMAX) 25 MG tablet Take 25 mg by mouth at bedtime. 03/26/14   Historical Provider, MD      Allergies  Allergen Reactions  . Tetracyclines & Related Nausea And Vomiting    ROS:  Out of a complete 14 system review of symptoms, the patient complains only of the following  symptoms, and all other reviewed systems are negative.  Light sensitivity Nausea, vomiting Joint pain, back pain, neck pain Headache, numbness, speech difficulty  Blood pressure 109/76, pulse 67, height 5\' 4"  (1.626 m), weight 144 lb 3.2 oz (65.409 kg).  Physical Exam  General: The patient is alert and cooperative at the time of the examination.  Eyes: Pupils are equal, round, and reactive to light. Discs are flat bilaterally.  Neck: The neck is supple, no carotid bruits are noted.  Respiratory: The respiratory examination is clear.  Cardiovascular: The cardiovascular examination reveals a regular rate and rhythm,  no obvious murmurs or rubs are noted.  Neuromuscular: Range of movement of the cervical spine is full with turning to the right, the patient lacks about 25 turn the head to the left. No crepitus is noted in the temporomandibular joints.  Skin: Extremities are without significant edema.  Neurologic Exam  Mental status: The patient is alert and oriented x 3 at the time of the examination. The patient has apparent normal recent and remote memory, with an apparently normal attention span and concentration ability.  Cranial nerves: Facial symmetry is present. There is good sensation of the face to pinprick and soft touch bilaterally. The strength of the facial muscles and the muscles to head turning and shoulder shrug are normal bilaterally. Speech is well enunciated, no aphasia or dysarthria is noted. Extraocular movements are full. Visual fields are full. The tongue is midline, and the patient has symmetric elevation of the soft palate. No obvious hearing deficits are noted.  Motor: The motor testing reveals 5 over 5 strength of all 4 extremities. Good symmetric motor tone is noted throughout.  Sensory: Sensory testing is intact to pinprick, soft touch, vibration sensation, and position sense on all 4 extremities. No evidence of extinction is noted.  Coordination: Cerebellar testing reveals good finger-nose-finger and heel-to-shin bilaterally.  Gait and station: Gait is normal. Tandem gait is normal. Romberg is negative. No drift is seen.  Reflexes: Deep tendon reflexes are symmetric and normal bilaterally. Toes are downgoing bilaterally.   Assessment/Plan:  1. Classic migraine  The patient has intractable headaches at this time. She is on low-dose Topamax, the medication will be increased taking 50 mg at night for 2 weeks, then a 75 mg at night. The patient will be given Amerge to take if the headache comes on. She will follow-up in about 4 months. If she continues to have issues with the  headache, she is to contact our office.  Jill Alexanders MD 05/09/2014 8:56 PM  [facility] [address] [place], [place] [address]  Phone [phone] Fax [phone]

## 2014-05-09 NOTE — Patient Instructions (Signed)

## 2014-06-25 ENCOUNTER — Emergency Department (HOSPITAL_BASED_OUTPATIENT_CLINIC_OR_DEPARTMENT_OTHER)
Admission: EM | Admit: 2014-06-25 | Discharge: 2014-06-25 | Discharge status: Against Medical Advice | Payer: Federal, State, Local not specified - PPO

## 2014-06-25 NOTE — ED Notes (Signed)
Pt jerked off [employer] and states she is going to [place] the wait is only 18 mins also states "do not charge me because i havent seen anybody"

## 2014-09-10 ENCOUNTER — Telehealth: Payer: Self-pay | Admitting: *Deleted

## 2014-09-10 ENCOUNTER — Ambulatory Visit: Payer: Federal, State, Local not specified - PPO | Admitting: Nurse Practitioner

## 2014-09-10 NOTE — Telephone Encounter (Signed)
Called pt checking on her.  She did not know she had appt.   She apologized.  I made app for Thursday at 0830 09-12-14, (she has been taking topamax (25mg )  1 tablet po qhs, since taking anymore causes her to have sensation of cold, and numbness.  Wanted to try another something else.  Made appt 09-12-14 at 0830.

## 2014-09-11 ENCOUNTER — Encounter: Payer: Self-pay | Admitting: Nurse Practitioner

## 2014-09-12 ENCOUNTER — Telehealth: Payer: Self-pay | Admitting: Nurse Practitioner

## 2014-09-12 ENCOUNTER — Ambulatory Visit (INDEPENDENT_AMBULATORY_CARE_PROVIDER_SITE_OTHER): Payer: Federal, State, Local not specified - PPO | Admitting: Nurse Practitioner

## 2014-09-12 ENCOUNTER — Encounter: Payer: Self-pay | Admitting: Nurse Practitioner

## 2014-09-12 VITALS — BP 92/60 | HR 84 | Ht 64.0 in | Wt 141.0 lb

## 2014-09-12 DIAGNOSIS — G43109 Migraine with aura, not intractable, without status migrainosus: Secondary | ICD-10-CM

## 2014-09-12 MED ORDER — NARATRIPTAN HCL 2.5 MG PO TABS: 2.5000 mg | ORAL_TABLET | Freq: Two times a day (BID) | ORAL | Status: DC | PRN

## 2014-09-12 NOTE — Progress Notes (Signed)
[facility]  PATIENT: Alison Sampson DOB: [birth year]   REASON FOR VISIT: Follow-up for classic migraine HISTORY FROM: Patient    HISTORY OF PRESENT ILLNESS: Alison Sampson, 44 year old female returns for follow-up. She has a history of classic migraine. Her headaches include the entire head associated with a throbbing sensation she also has photophobia and phonophobia. She recently changed her diet and her headaches have gotten better. This was a suggestion by her gastroenterologist. She has a mild headache today. She is currently on Topamax 25 mg at night, when she tried to increase the dose she noticed numbness and tingling of the extremities went back to the original 25 mg. Amerge helps acutely. She is also started yoga which has been beneficial. She is asking about Cefaly which is an FDA approved external trigeminal nerve stimulator for migraines,  she returns for reevaluation.    HISTORY: (KW)Alison Sampson is a 44 year old right-handed white female with a history of migraine headaches. The patient was seen over 4 years ago by Dr. Erling Cruz with a two-year history at that time of headache. The patient indicates that the headaches include the entire head, associated with a throbbing sensation. The patient may develop some left-sided facial numbness, dizziness, and some nausea. The patient feels weak all over with the headache. She indicates that the headaches have become more frequent over time, and she is now having 10-15 headache days a month. At least 4 days of the month she is missing work. The patient works out of the home, but she finds that she is completely unable to function at times. She reports lights in the vision, with a tunnel vision associated with the headache. The patient may have some neck discomfort, but she has chronic cervical spondylosis. She has found that excessive caffeine intake and alcohol may bring on the headache. A disrupted sleep schedule, and certain odors  such as perfumes may bring on the headache. The headache may occur just prior to the onset of her menstrual cycle. She has been on low-dose Topamax taking 25 mg at night. She has noted that hydrocodone will worsen the headache at times. She denies any family history of headache but she does not know much about her mother's side of the family. The headaches last 2-4 days. The patient does report photophobia and phonophobia with the headache.    REVIEW OF SYSTEMS: Full 14 system review of systems performed and notable only for those listed, all others are neg:  Constitutional: neg  Cardiovascular: neg Ear/Nose/Throat: neg  Skin: neg Eyes: Light sensitivity Respiratory: neg Gastroitestinal: neg  Hematology/Lymphatic: neg  Endocrine: neg Musculoskeletal:neg Allergy/Immunology: neg Neurological: Dizziness, headache Psychiatric: neg Sleep : neg   ALLERGIES: Allergies  Allergen Reactions  . Tetracyclines & Related Nausea And Vomiting    HOME MEDICATIONS: Outpatient Prescriptions Prior to Visit  Medication Sig Dispense Refill  . naratriptan (AMERGE) 2.5 MG tablet Take 1 tablet (2.5 mg total) by mouth 2 (two) times daily as needed for migraine. 10 tablet 0  . topiramate (TOPAMAX) 25 MG tablet 2 tablets at night for 2 weeks, then take 3 tablets at night (Patient taking differently: 2 tablets at night for 3 weeks,) 90 tablet 3  . carisoprodol (SOMA) 350 MG tablet Take 350 mg by mouth daily as needed.     No facility-administered medications prior to visit.    PAST MEDICAL HISTORY: Past Medical History  Diagnosis Date  . Arthritis   . Umbilical hernia   . Hearing loss   .  PONV (postoperative nausea and vomiting)   . GERD (gastroesophageal reflux disease)   . Classic migraine 05/09/2014    PAST SURGICAL HISTORY: Past Surgical History  Procedure Laterality Date  . Tubal ligation  2003  . Arthroscopic repair acl  2010, 2011    ACL Repair x 2  . Cosmetic surgery  1990     Breast Augmentation  . Breast surgery  04/1988    Breast Implants  . Back surgery    . Diagnostic laparoscopy      tubal ligation  . Umbilical hernia repair  03/24/2011    Procedure: HERNIA REPAIR UMBILICAL ADULT;  Surgeon: Joyice Faster. Cornett, MD;  Location: [facility];  Service: General;  Laterality: N/A;  umbilicus    FAMILY HISTORY: Family History  Problem Relation Age of Onset  . Cancer Maternal Grandmother     breast and ovarian  . Heart disease Maternal Grandmother   . Cancer Paternal Grandmother     lung  . Healthy Brother   . Migraines Neg Hx     SOCIAL HISTORY: Social History   Social History  . Marital Status: Married    Spouse Name: N/A  . Number of Children: 3  . Years of Education: doctorate   Occupational History  . [employer]     works from home   Social History Main Topics  . Smoking status: Never Smoker   . Smokeless tobacco: Never Used  . Alcohol Use: No  . Drug Use: No  . Sexual Activity: Yes   Other Topics Concern  . Not on file   Social History Narrative   Patient is right handed.   Patient does not drink caffeine.     PHYSICAL EXAM  Filed Vitals:   09/12/14 0825  BP: 92/60  Pulse: 84  Height: 5\' 4"  (1.626 m)  Weight: 141 lb (63.957 kg)   Body mass index is 24.19 kg/(m^2). General: The patient is alert and cooperative at the time of the examination. Neck: The neck is supple, no carotid bruits are noted. Skin: Extremities are without significant edema.  Neurologic Exam  Mental status: The patient is alert and oriented x 3 at the time of the examination. The patient has apparent normal recent and remote memory, with an apparently normal attention span and concentration ability. Cranial nerves: Facial symmetry is present. There is good sensation of the face to pinprick and soft touch bilaterally. The strength of the facial muscles and the muscles to head turning and shoulder shrug are normal  bilaterally. Speech is well enunciated, no aphasia or dysarthria is noted.Pupils are equal, round, and reactive to light. Discs are flat bilaterally. Extraocular movements are full. Visual fields are full. The tongue is midline, and the patient has symmetric elevation of the soft palate. No obvious hearing deficits are noted. Motor: The motor testing reveals 5 over 5 strength of all 4 extremities. Good symmetric motor tone is noted throughout. Sensory: Sensory testing is intact to pinprick, soft touch, vibration sensation, and position sense on all 4 extremities. No evidence of extinction is noted. Coordination: Cerebellar testing reveals good finger-nose-finger and heel-to-shin bilaterally. Gait and station: Gait is normal. Tandem gait is normal. Romberg is negative. No drift is seen. Reflexes: Deep tendon reflexes are symmetric and normal bilaterally. Toes are downgoing bilaterally.  DIAGNOSTIC DATA (LABS, IMAGING, TESTING) -  ASSESSMENT AND PLAN  44 y.o. year old female  has a past medical history of Classic migraine (05/09/2014). here to follow-up.  Increase  Topamax to  2 tabs at hs call me in 3 weeks to see how you are affected (side effects)if no side effects will continue to increase slowly. Given a list of migraine triggers especially foods environmental etc.  Will research Cefaly I spent additional 10 minutes in total face to face time with the patient more than 50% of which was spent counseling and coordination of care, reviewing test results reviewing medications and discussing common side effects of Topamax  and reviewing the diagnosis of migraine  and further treatment options. MRI of the brain, with and without contrast on 02/03/2014 was normal. She will also keep a diary of headaches F/U in 6 weeks Dennie Bible, [facility], [facility], [facility] [address] [place], [place] [address] [phone]

## 2014-09-12 NOTE — Telephone Encounter (Signed)
Alison Sampson forgot to mention she is out of refills on her Naratriptan during her visit today.  She wants to make sure she doesn't run out.

## 2014-09-12 NOTE — Patient Instructions (Addendum)
Increase Topamax to  2 tabs at hs call me in 3 weeks to see how you are affected (side effects) Given migraine triggers Will research Cefaly F/U in 6 weeks

## 2014-09-12 NOTE — Telephone Encounter (Signed)
Alison Sampson already sent this Rx.

## 2014-09-12 NOTE — Progress Notes (Signed)
I have read the note, and I agree with the clinical assessment and plan.  WILLIS,CHARLES KEITH   

## 2014-10-23 ENCOUNTER — Ambulatory Visit: Payer: Federal, State, Local not specified - PPO | Admitting: Nurse Practitioner

## 2014-10-24 ENCOUNTER — Ambulatory Visit: Payer: Federal, State, Local not specified - PPO | Admitting: Nurse Practitioner

## 2015-01-29 ENCOUNTER — Other Ambulatory Visit: Payer: Self-pay | Admitting: Specialist

## 2015-01-29 DIAGNOSIS — M5412 Radiculopathy, cervical region: Secondary | ICD-10-CM

## 2015-02-02 ENCOUNTER — Ambulatory Visit
Admission: RE | Admit: 2015-02-02 | Discharge: 2015-02-02 | Disposition: A | Discharge status: Home / Self Care | Payer: Federal, State, Local not specified - PPO | Source: Ambulatory Visit | Attending: Specialist | Admitting: Specialist

## 2015-02-02 DIAGNOSIS — M5412 Radiculopathy, cervical region: Secondary | ICD-10-CM

## 2015-04-24 ENCOUNTER — Other Ambulatory Visit: Payer: Self-pay | Admitting: Specialist

## 2015-04-24 DIAGNOSIS — R55 Syncope and collapse: Secondary | ICD-10-CM

## 2015-05-04 ENCOUNTER — Other Ambulatory Visit: Payer: Federal, State, Local not specified - PPO

## 2015-05-11 ENCOUNTER — Other Ambulatory Visit: Payer: Federal, State, Local not specified - PPO

## 2015-05-17 ENCOUNTER — Ambulatory Visit
Admission: RE | Admit: 2015-05-17 | Discharge: 2015-05-17 | Disposition: A | Discharge status: Home / Self Care | Payer: Federal, State, Local not specified - PPO | Source: Ambulatory Visit | Attending: Specialist | Admitting: Specialist

## 2015-05-17 ENCOUNTER — Other Ambulatory Visit: Payer: Federal, State, Local not specified - PPO

## 2015-05-17 DIAGNOSIS — R55 Syncope and collapse: Secondary | ICD-10-CM

## 2015-10-31 ENCOUNTER — Other Ambulatory Visit: Payer: Self-pay | Admitting: Physician Assistant

## 2015-10-31 DIAGNOSIS — G40909 Epilepsy, unspecified, not intractable, without status epilepticus: Secondary | ICD-10-CM

## 2015-11-05 ENCOUNTER — Other Ambulatory Visit: Payer: Self-pay | Admitting: Physician Assistant

## 2015-11-05 DIAGNOSIS — G40909 Epilepsy, unspecified, not intractable, without status epilepticus: Secondary | ICD-10-CM

## 2015-11-16 ENCOUNTER — Ambulatory Visit
Admission: RE | Admit: 2015-11-16 | Discharge: 2015-11-16 | Disposition: A | Discharge status: Home / Self Care | Payer: Federal, State, Local not specified - PPO | Source: Ambulatory Visit | Attending: Physician Assistant | Admitting: Physician Assistant

## 2015-11-16 DIAGNOSIS — G40909 Epilepsy, unspecified, not intractable, without status epilepticus: Secondary | ICD-10-CM

## 2015-12-24 DIAGNOSIS — M5030 Other cervical disc degeneration, unspecified cervical region: Secondary | ICD-10-CM | POA: Insufficient documentation

## 2018-04-04 ENCOUNTER — Ambulatory Visit: Payer: Self-pay

## 2018-04-04 NOTE — Telephone Encounter (Signed)
Incoming call from Patient she has been experiencing palpitations, they occur randomly, sitting walking eve during the night.  Duration can be a minute or two.  Comes and goes. 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  Questionable heart murmer.  Asymptomatic   Reports Dizziness  with the flutters and the SOB.  LMP last week. Patient scheduled for an acute visit with Dr.  Raoul Pitch on 04/05/18 10:45.  Patient  is to arrive at 10:30.  Patient is also scheduled for  New Patient appointment for Wed. April 19, 2018 with  Dr.  Raoul Pitch.       Reason for Disposition . [1] Palpitations AND [2] no improvement after using CARE ADVICE  Answer Assessment - Initial Assessment Questions 1. DESCRIPTION: "Please describe your heart rate or heart beat that you are having" (e.g., fast/slow, regular/irregular, skipped or extra beats, "palpitations")     Randomly sitting down walking even at night  Feels like getting dizzy 2. ONSET: "When did it start?" (Minutes, hours or days)      About 50months ago.   3. DURATION: "How long does it last" (e.g., seconds, minutes, hours)     A minute or two 4. PATTERN "Does it come and go, or has it been constant since it started?"  "Does it get worse with exertion?"   "Are you feeling it now?"     Come and go 5. TAP: "Using your hand, can you tap out what you are feeling on a chair or table in front of you, so that I can hear?" (Note: not all patients can do this)       *No Answer* 6. HEART RATE: "Can you tell me your heart rate?" "How many beats in 15 seconds?"  (Note: not all patients can do this)       Neuropathy in fingers.  uable to feel puslse 7. RECURRENT SYMPTOM: "Have you ever had this before?" If so, ask: "When was the last time?" and "What happened that time?"      *No Answer* 8. CAUSE: "What do you think is causing the palpitations?"     na 9. CARDIAC HISTORY: "Do you have any history of heart disease?" (e.g., heart attack, angina, bypass surgery,  angioplasty, arrhythmia)      Heart  Defect ?  Heart murmer asymtomatic 10. OTHER SYMPTOMS: "Do you have any other symptoms?" (e.g., dizziness, chest pain, sweating, difficulty breathing)       Dizziness  with the flutters then  SOB 11. PREGNANCY: "Is there any chance you are pregnant?" "When was your last menstrual period?"       LMP last.week. tubal  Protocols used: HEART RATE AND HEARTBEAT QUESTIONS-A-AH mes and goes

## 2018-04-05 ENCOUNTER — Other Ambulatory Visit: Payer: Self-pay

## 2018-04-05 ENCOUNTER — Encounter: Payer: Self-pay | Admitting: Family Medicine

## 2018-04-05 ENCOUNTER — Ambulatory Visit (INDEPENDENT_AMBULATORY_CARE_PROVIDER_SITE_OTHER): Payer: Managed Care, Other (non HMO) | Admitting: Family Medicine

## 2018-04-05 VITALS — BP 118/82 | HR 81 | Temp 98.2°F | Resp 16 | Ht 64.0 in | Wt 176.4 lb

## 2018-04-05 DIAGNOSIS — R079 Chest pain, unspecified: Secondary | ICD-10-CM | POA: Diagnosis not present

## 2018-04-05 DIAGNOSIS — R002 Palpitations: Secondary | ICD-10-CM

## 2018-04-05 DIAGNOSIS — Z7689 Persons encountering health services in other specified circumstances: Secondary | ICD-10-CM | POA: Diagnosis not present

## 2018-04-05 DIAGNOSIS — Z8249 Family history of ischemic heart disease and other diseases of the circulatory system: Secondary | ICD-10-CM | POA: Insufficient documentation

## 2018-04-05 DIAGNOSIS — E669 Obesity, unspecified: Secondary | ICD-10-CM

## 2018-04-05 DIAGNOSIS — Z01419 Encounter for gynecological examination (general) (routine) without abnormal findings: Secondary | ICD-10-CM

## 2018-04-05 LAB — COMPREHENSIVE METABOLIC PANEL
ALT: 18 U/L (ref 0–35)
AST: 18 U/L (ref 0–37)
Albumin: 4.6 g/dL (ref 3.5–5.2)
Alkaline Phosphatase: 46 U/L (ref 39–117)
BILIRUBIN TOTAL: 0.4 mg/dL (ref 0.2–1.2)
BUN: 7 mg/dL (ref 6–23)
CALCIUM: 9.3 mg/dL (ref 8.4–10.5)
CO2: 29 mEq/L (ref 19–32)
CREATININE: 0.62 mg/dL (ref 0.40–1.20)
Chloride: 101 mEq/L (ref 96–112)
GFR: 103.09 mL/min (ref >60.00)
Glucose, Bld: 86 mg/dL (ref 70–99)
Potassium: 4 mEq/L (ref 3.5–5.1)
Sodium: 138 mEq/L (ref 135–145)
Total Protein: 7.1 g/dL (ref 6.0–8.3)

## 2018-04-05 LAB — CBC
HCT: 39.6 % (ref 36.0–46.0)
Hemoglobin: 13.4 g/dL (ref 12.0–15.0)
MCHC: 33.9 g/dL (ref 30.0–36.0)
MCV: 85 fl (ref 78.0–100.0)
PLATELETS: 360 10*3/uL (ref 150.0–400.0)
RBC: 4.66 Mil/uL (ref 3.87–5.11)
RDW: 13.4 % (ref 11.5–15.5)
WBC: 5.9 10*3/uL (ref 4.0–10.5)

## 2018-04-05 LAB — LIPID PANEL
CHOLESTEROL: 109 mg/dL (ref 0–200)
HDL: 71.4 mg/dL (ref >39.00)
LDL Cholesterol: 30 mg/dL (ref 0–99)
NonHDL: 37.17
TRIGLYCERIDES: 37 mg/dL (ref 0.0–149.0)
Total CHOL/HDL Ratio: 2
VLDL: 7.4 mg/dL (ref 0.0–40.0)

## 2018-04-05 LAB — TSH: TSH: 2.64 u[IU]/mL (ref 0.35–4.50)

## 2018-04-05 LAB — T4, FREE: Free T4: 0.81 ng/dL (ref 0.60–1.60)

## 2018-04-05 NOTE — Patient Instructions (Addendum)
We will call you with lab results.  If labs normal and without cause I will refer you to cardiology.   Palpitations Palpitations are feelings that your heartbeat is not normal. Your heartbeat may feel like it is:  Uneven.  Faster than normal.  Fluttering.  Skipping a beat. This is usually not a serious problem. In some cases, you may need tests to rule out any serious problems. Follow these instructions at home: Pay attention to any changes in your condition. Take these actions to help manage your symptoms: Eating and drinking  Avoid: ? Coffee, tea, soft drinks, and energy drinks. ? Chocolate. ? Alcohol. ? Diet pills. Lifestyle   Try to lower your stress. These things can help you relax: ? Yoga. ? Deep breathing and meditation. ? Exercise. ? Using words and images to create positive thoughts (guided imagery). ? Using your mind to control things in your body (biofeedback).  Do not use drugs.  Get plenty of rest and sleep. Keep a regular bed time. General instructions   Take over-the-counter and prescription medicines only as told by your doctor.  Do not use any products that contain nicotine or tobacco, such as cigarettes and e-cigarettes. If you need help quitting, ask your doctor.  Keep all follow-up visits as told by your doctor. This is important. You may need more tests if palpitations do not go away or get worse. Contact a doctor if:  Your symptoms last more than 24 hours.  Your symptoms occur more often. Get help right away if you:  Have chest pain.  Feel short of breath.  Have a very bad headache.  Feel dizzy.  Pass out (faint). Summary  Palpitations are feelings that your heartbeat is uneven or faster than normal. It may feel like your heart is fluttering or skipping a beat.  Avoid food and drinks that may cause palpitations. These include caffeine, chocolate, and alcohol.  Try to lower your stress. Do not smoke or use drugs.  Get help  right away if you faint or have chest pain, shortness of breath, a severe headache, or dizziness. This information is not intended to replace advice given to you by your health care provider. Make sure you discuss any questions you have with your health care provider. Document Released: 10/21/2007 Document Revised: 02/23/2017 Document Reviewed: 02/23/2017 Elsevier Interactive Patient Education  2019 [employer].  Please help [place] help you:  We are honored you have chosen [facility] for your Primary Care home. Below you will find basic instructions that you may need to access in the future. Please help [place] help you by reading the instructions, which cover many of the frequent questions we experience.   Prescription refills and request:  -In order to allow more efficient response time, please call your pharmacy for all refills. They will forward the request electronically to [place]. This allows for the quickest possible response. Request left on a nurse line can take longer to refill, since these are checked as time allows between office patients and other phone calls.  - refill request can take up to 3-5 working days to complete.  - If request is sent electronically and request is appropiate, it is usually completed in 1-2 business days.  - all patients will need to be seen routinely for all chronic medical conditions requiring prescription medications (see follow-up below). If you are overdue for follow up on your condition, you will be asked to make an appointment and we will call in enough medication  to cover you until your appointment (up to 30 days).  - all controlled substances will require a face to face visit to request/refill.  - if you desire your prescriptions to go through a new pharmacy, and have an active script at original pharmacy, you will need to call your pharmacy and have scripts transferred to new pharmacy. This is completed between the pharmacy locations and not by your provider.     Results: If any images or labs were ordered, it can take up to 1 week to get results depending on the test ordered and the lab/facility running and resulting the test. - Normal or stable results, which do not need further discussion, may be released to your mychart immediately with attached note to you. A call may not be generated for normal results. Please make certain to sign up for mychart. If you have questions on how to activate your mychart you can call the front office.  - If your results need further discussion, our office will attempt to contact you via phone, and if unable to reach you after 2 attempts, we will release your abnormal result to your mychart with instructions.  - All results will be automatically released in mychart after 1 week.  - Your provider will provide you with explanation and instruction on all relevant material in your results. Please keep in mind, results and labs may appear confusing or abnormal to the untrained eye, but it does not mean they are actually abnormal for you personally. If you have any questions about your results that are not covered, or you desire more detailed explanation than what was provided, you should make an appointment with your provider to do so.   Our office handles many outgoing and incoming calls daily. If we have not contacted you within 1 week about your results, please check your mychart to see if there is a message first and if not, then contact our office.  In helping with this matter, you help decrease call volume, and therefore allow [place] to be able to respond to patients needs more efficiently.   Acute office visits (sick visit):  An acute visit is intended for a new problem and are scheduled in shorter time slots to allow schedule openings for patients with new problems. This is the appropriate visit to discuss a new problem. Problems will not be addressed by phone call or Echart message. Appointment is needed if requesting  treatment. In order to provide you with excellent quality medical care with proper time for you to explain your problem, have an exam and receive treatment with instructions, these appointments should be limited to one new problem per visit. If you experience a new problem, in which you desire to be addressed, please make an acute office visit, we save openings on the schedule to accommodate you. Please do not save your new problem for any other type of visit, let us take care of it properly and quickly for you.   Follow up visits:  Depending on your condition(s) your provider will need to see you routinely in order to provide you with quality care and prescribe medication(s). Most chronic conditions (Example: hypertension, Diabetes, depression/anxiety... etc), require visits a couple times a year. Your provider will instruct you on proper follow up for your personal medical conditions and history. Please make certain to make follow up appointments for your condition as instructed. Failing to do so could result in lapse in your medication treatment/refills. If you request a refill, and  are overdue to be seen on a condition, we will always provide you with a 30 day script (once) to allow you time to schedule.    Medicare wellness (well visit): - we have a wonderful Nurse Maudie Mercury), that will meet with you and provide you will yearly medicare wellness visits. These visits should occur yearly (can not be scheduled less than 1 calendar year apart) and cover preventive health, immunizations, advance directives and screenings you are entitled to yearly through your medicare benefits. Do not miss out on your entitled benefits, this is when medicare will pay for these benefits to be ordered for you.  These are strongly encouraged by your provider and is the appropriate type of visit to make certain you are up to date with all preventive health benefits. If you have not had your medicare wellness exam in the last 12  months, please make certain to schedule one by calling the office and schedule your medicare wellness with Maudie Mercury as soon as possible.   Yearly physical (well visit):  - Adults are recommended to be seen yearly for physicals. Check with your insurance and date of your last physical, most insurances require one calendar year between physicals. Physicals include all preventive health topics, screenings, medical exam and labs that are appropriate for gender/age and history. You may have fasting labs needed at this visit. This is a well visit (not a sick visit), new problems should not be covered during this visit (see acute visit).  - Pediatric patients are seen more frequently when they are younger. Your provider will advise you on well child visit timing that is appropriate for your their age. - This is not a medicare wellness visit. Medicare wellness exams do not have an exam portion to the visit. Some medicare companies allow for a physical, some do not allow a yearly physical. If your medicare allows a yearly physical you can schedule the medicare wellness with our nurse Maudie Mercury and have your physical with your provider after, on the same day. Please check with insurance for your full benefits.   Late Policy/No Shows:  - all new patients should arrive 15-30 minutes earlier than appointment to allow [place] time  to  obtain all personal demographics,  insurance information and for you to complete office paperwork. - All established patients should arrive 10-15 minutes earlier than appointment time to update all information and be checked in .  - In our best efforts to run on time, if you are late for your appointment you will be asked to either reschedule or if able, we will work you back into the schedule. There will be a wait time to work you back in the schedule,  depending on availability.  - If you are unable to make it to your appointment as scheduled, please call 24 hours ahead of time to allow [place] to fill the  time slot with someone else who needs to be seen. If you do not cancel your appointment ahead of time, you may be charged a no show fee.

## 2018-04-05 NOTE — Progress Notes (Signed)
Patient ID: Alison Sampson, female  DOB: 04-Sep-1970, 48 y.o.   MRN: [id number] Patient Care Team    Relationship Specialty Notifications Start End  Ma Hillock, DO PCP - General Family Medicine  04/05/18   Boyd Kerbs, MD Consulting Physician Neurology  04/05/18   Azalia Bilis, MD Referring Physician Gastroenterology  04/05/18   Rulon Sera, MD  Rehabilitation  04/05/18    Comment: ortho    Chief Complaint  Patient presents with  . Establish Care    Previous PCP: No previous PCP, did go to [facility] 2013  . Chest Pain  . Palpitations  . Fatigue  . Shortness of Breath  . Dizziness    Subjective:  Alison Sampson is a 48 y.o.  female present for new patient establishment. All past medical history, surgical history, allergies, family history, immunizations, medications and social history were updated in the electronic medical record today. All recent labs, ED visits and hospitalizations within the last year were reviewed.  Chest pain/palpitations:  Patient reports she has been experiencing "chest pain" and "flutter" feeling in her chest multiple times a day for the last 3 months. She reports the flutters take her breath away when they occur. They last a few seconds and resolve on their own. They are not associated with any activity, meals, position. She endorses increased fatigue over this time. She reports feeling lightheaded on occassions, not associated with flutter feeling. She states she was told she had an irregular heart beat in the past, but does not know the specifics surrounding it. She endorses chest pain behind her sternum. She felt it was similar to heart burn, but she has never had the associated symptoms mentioned above with her reflux. She is currently not on reflux meds. Patient's last menstrual period was 03/24/2018. She has a strong fhx of heart disease and is concerned. Pt does not consume any caffeine or stimulants.   She has significant medical  history migraines, seizure d/o and asthma. She is prescribed trokendi XR and has been on this medication for at least a year. No recent med changes or dietary changes.   Depression screen [facility] 2/9 04/05/2018  Decreased Interest 0  Down, Depressed, Hopeless 0  PHQ - 2 Score 0   No flowsheet data found.     No flowsheet data found. Immunization History  Administered Date(s) Administered  . Influenza Inj Mdck Quad Pf 10/09/2017  . MMR 10/10/2017  . PPD Test 10/09/2017  . Tdap 10/09/2017    No exam data present  Past Medical History:  Diagnosis Date  . Arthritis   . Asthma   . 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   . Classic migraine 05/09/2014  . 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 GERD (gastroesophageal reflux disease)   . Hearing loss   . Heart murmur   . History of blood transfusion   . IBS (irritable bowel syndrome)   . Migraine   . PONV (postoperative nausea and vomiting)   . Seizures ([place])    last seizure ~03/2017  . Umbilical hernia   . Vitamin D deficiency    Allergies  Allergen Reactions  . Tetracyclines & Related Nausea And Vomiting   Past Surgical History:  Procedure Laterality Date  . ABDOMINOPLASTY    .  ARTHROSCOPIC REPAIR ACL  2010, 2011   ACL Repair x 2  . BREAST ENHANCEMENT SURGERY  1990   implants  . CESAREAN SECTION  1990  . CESAREAN SECTION  2003  . LUMBAR DISC SURGERY     x5 surgeries per pt; no hardware  . TUBAL LIGATION  2003  . UMBILICAL HERNIA REPAIR  03/24/2011   Procedure: HERNIA REPAIR UMBILICAL ADULT;  Surgeon: Joyice Faster. Cornett, MD;  Location: [facility];  Service: General;  Laterality: N/A;  umbilicus   Family History  Problem Relation Age of Onset  . Diabetes Mother   . Early death Mother        drug overdose  . Heart attack Mother    . Heart disease Mother   . Hypertension Mother   . Bipolar disorder Mother   . Heart attack Father   . Hypertension Father   . Hyperlipidemia Father   . Heart disease Father   . Cancer Maternal Grandmother        breast and ovarian  . Heart disease Maternal Grandmother   . Breast cancer Maternal Grandmother   . Ovarian cancer Maternal Grandmother   . Diabetes Maternal Grandmother   . Heart attack Maternal Grandmother   . Hyperlipidemia Maternal Grandmother   . Hypertension Maternal Grandmother   . Cancer Paternal Grandmother        lung  . Alcohol abuse Paternal Grandmother   . Lung cancer Paternal Grandmother   . Diabetes Paternal Grandmother   . Heart attack Paternal Grandmother   . Hyperlipidemia Paternal Grandmother   . Heart disease Paternal Grandmother   . Hypertension Paternal Grandmother   . Alcohol abuse Maternal Grandfather   . Diabetes Maternal Grandfather   . Heart attack Maternal Grandfather   . Hyperlipidemia Maternal Grandfather   . Heart disease Maternal Grandfather   . Hypertension Maternal Grandfather   . Stroke Maternal Grandfather   . Cancer Paternal Grandfather   . Heart attack Paternal Grandfather   . Hyperlipidemia Paternal Grandfather   . Heart disease Paternal Grandfather   . Hypertension Paternal Grandfather   . Migraines Neg Hx    Social History   Social History Narrative   Patient is right handed.   Patient does not drink caffeine.   Marital status/children/pets: married, 3 children   Education/employment: PhD- [employer] (able to work most hours from home)   Safety:      -smoke alarm in the home:Yes     - wears seatbelt: Yes     - Feels safe in their relationships: Yes    Allergies as of 04/05/2018      Reactions   Tetracyclines & Related Nausea And Vomiting      Medication List       Accurate as of April 05, 2018 12:55 PM. Always use your most recent med list.        Trokendi XR 50 MG Cp24 Generic drug:   Topiramate ER Take 2 capsules by mouth 2 (two) times daily.       All past medical history, surgical history, allergies, family history, immunizations andmedications were updated in the EMR today and reviewed under the history and medication portions of their EMR.     No results found for this or any previous visit (from the past 2160 hour(s)).   ROS: 14 pt review of systems performed and negative (unless mentioned in an HPI)  Objective: BP 118/82 (BP Location: Left Arm, Patient Position: Sitting, Cuff Size: Normal)  Pulse 81   Temp 98.2 F (36.8 C) (Oral)   Resp 16   Ht 5' 4" (1.626 m)   Wt 176 lb 6 oz (80 kg)   LMP 03/24/2018   SpO2 98%   BMI 30.27 kg/m  Gen: Afebrile. No acute distress. Nontoxic in appearance, well-developed, well-nourished,  Pleasant obese caucasian female.  HENT: AT. [place].  MMM Eyes:Pupils Equal Round Reactive to light, Extraocular movements intact,  Conjunctiva without redness, discharge or icterus. Neck/lymp/endocrine: Supple,no lymphadenopathy, ? Mild right thyromegaly CV: RRR no murmur appreciated, no edema, +2/4 P posterior tibialis pulses. no carotid bruits. No JVD. Chest: CTAB, no wheeze, rhonchi or crackles. normal Respiratory effort. good Air movement. Abd: Soft. NTND. BS present Skin: no rashes, purpura or petechiae. Warm and well-perfused. Skin intact. Neuro/Msk: Normal gait. PERLA. EOMi. Alert. Oriented x3.   Psych: Normal affect, dress and demeanor. Normal speech. Normal thought content and judgment. EKG: NSR. HR 69. PR 152. Qtc 405.      Assessment/plan: Alison Sampson is a 48 y.o. female present for establishment with Chest pain complaints.   Chest pain, unspecified type/ FHx: heart disease/ Palpitations/Obesity (BMI 30-39.9) - no red flags on exam today. Will rule out electrolytes, iron deficiency  and thyroid as potential causes.  - EKG: NSR. HR 69. PR 152. Qtc 405.        - TSH - CBC - Comp Met (CMET) - Lipid panel - T4, free - Iron,  TIBC and Ferritin Panel - if work up is normal--> referral to cardiology will be placed. Pt is in agreement with plan.  - if labs normal- consider trial of Prilosec w/ h/o GERD.   Pap smear, as part of routine gynecological examination Pt asked for a referral to a GYN only provider (No OB) - Ambulatory referral to Gynecology  Pt has an appt later this month--> will make CPE.   Greater than 45 minutes was spent with patient, greater than 50% of that time was spent face-to-face with patient   Note is dictated utilizing voice recognition software. Although note has been proof read prior to signing, occasional typographical errors still can be missed. If any questions arise, please do not hesitate to call for verification.  Electronically signed by: Howard Pouch, DO [place]

## 2018-04-06 ENCOUNTER — Telehealth: Payer: Self-pay | Admitting: Family Medicine

## 2018-04-06 DIAGNOSIS — R079 Chest pain, unspecified: Secondary | ICD-10-CM

## 2018-04-06 DIAGNOSIS — E049 Nontoxic goiter, unspecified: Secondary | ICD-10-CM

## 2018-04-06 DIAGNOSIS — Z8249 Family history of ischemic heart disease and other diseases of the circulatory system: Secondary | ICD-10-CM

## 2018-04-06 DIAGNOSIS — R002 Palpitations: Secondary | ICD-10-CM

## 2018-04-06 LAB — IRON,TIBC AND FERRITIN PANEL
%SAT: 18 % (calc) (ref 16–45)
Ferritin: 22 ng/mL (ref 16–232)
IRON: 68 ug/dL (ref 40–190)
TIBC: 378 mcg/dL (calc) (ref 250–450)

## 2018-04-06 NOTE — Telephone Encounter (Signed)
Please inform patient the following information: Her labs are all normal and look really good. I have referred her to cardiology fur further evaluation on  her palpitations and chest discomfort.  I have also ordered a thyroid US and one additional thyroid test (lab) since her thyroid did feel slightly enlarged on the right side. We should make sure there are no nodules in her thyroid since it felt larger on one side. I do not want her to worry- some people just have mild enlargement for no medical concerning reason.  They will call her to schedule cardiology and thyroid US.  Please schedule her for lab appt only for the added thyroid antibodies test.

## 2018-04-06 NOTE — Telephone Encounter (Signed)
Pt advised and voiced understanding.    Lab apt made for 04/10/18 at 3:00pm.

## 2018-04-10 ENCOUNTER — Other Ambulatory Visit (INDEPENDENT_AMBULATORY_CARE_PROVIDER_SITE_OTHER): Payer: Managed Care, Other (non HMO)

## 2018-04-10 ENCOUNTER — Other Ambulatory Visit: Payer: Self-pay

## 2018-04-10 DIAGNOSIS — R002 Palpitations: Secondary | ICD-10-CM | POA: Diagnosis not present

## 2018-04-10 DIAGNOSIS — E049 Nontoxic goiter, unspecified: Secondary | ICD-10-CM

## 2018-04-11 ENCOUNTER — Ambulatory Visit
Admission: RE | Admit: 2018-04-11 | Discharge: 2018-04-11 | Disposition: A | Discharge status: Home / Self Care | Payer: Managed Care, Other (non HMO) | Source: Ambulatory Visit | Attending: Family Medicine | Admitting: Family Medicine

## 2018-04-11 DIAGNOSIS — R002 Palpitations: Secondary | ICD-10-CM

## 2018-04-11 DIAGNOSIS — E049 Nontoxic goiter, unspecified: Secondary | ICD-10-CM

## 2018-04-11 LAB — THYROID PEROXIDASE ANTIBODY: Thyroperoxidase Ab SerPl-aCnc: 1 IU/mL (ref <9)

## 2018-04-18 ENCOUNTER — Encounter: Payer: Managed Care, Other (non HMO) | Admitting: Advanced Practice Midwife

## 2018-04-19 ENCOUNTER — Encounter: Payer: Managed Care, Other (non HMO) | Admitting: Family Medicine

## 2018-05-02 ENCOUNTER — Other Ambulatory Visit: Payer: Self-pay

## 2018-05-02 ENCOUNTER — Telehealth: Payer: Self-pay | Admitting: *Deleted

## 2018-05-02 ENCOUNTER — Encounter (HOSPITAL_BASED_OUTPATIENT_CLINIC_OR_DEPARTMENT_OTHER): Payer: Self-pay | Admitting: Emergency Medicine

## 2018-05-02 ENCOUNTER — Emergency Department (HOSPITAL_BASED_OUTPATIENT_CLINIC_OR_DEPARTMENT_OTHER)
Admission: EM | Admit: 2018-05-02 | Discharge: 2018-05-03 | Disposition: A | Discharge status: Home / Self Care | Payer: Managed Care, Other (non HMO) | Attending: Emergency Medicine | Admitting: Emergency Medicine

## 2018-05-02 DIAGNOSIS — L299 Pruritus, unspecified: Secondary | ICD-10-CM | POA: Insufficient documentation

## 2018-05-02 DIAGNOSIS — J45909 Unspecified asthma, uncomplicated: Secondary | ICD-10-CM | POA: Insufficient documentation

## 2018-05-02 DIAGNOSIS — R0602 Shortness of breath: Secondary | ICD-10-CM | POA: Diagnosis not present

## 2018-05-02 DIAGNOSIS — T7840XA Allergy, unspecified, initial encounter: Secondary | ICD-10-CM

## 2018-05-02 DIAGNOSIS — R202 Paresthesia of skin: Secondary | ICD-10-CM | POA: Diagnosis present

## 2018-05-02 NOTE — ED Triage Notes (Signed)
Pt states she started [employer] today  States she had two injections of it today  Pt states about 2 hrs after the injections she started having her face started tingling and feeling numb, then her eyes started getting puffy and just a short bit ago she started feeling short of breath  Pt called her neurologist and he told her to come in

## 2018-05-02 NOTE — Telephone Encounter (Signed)
LEFT MESSAGE TO CALL BACK - PER DR HARDING - DEPENDING IF PATIENT HAVING ACTIVE SYMPTOMS CAN  KEEP APPT   OR RESCHEDULE WHEN OFFICE VISIT ARE APPROP.   IF RESCHEDULE - SHE IS PRIORITY 1

## 2018-05-03 MED ORDER — DIPHENHYDRAMINE HCL 25 MG PO CAPS
50.0000 mg | ORAL_CAPSULE | Freq: Once | ORAL | Status: AC
  Administered 2018-05-03: 50 mg via ORAL
  Filled 2018-05-03: qty 2

## 2018-05-03 MED ORDER — FAMOTIDINE 20 MG PO TABS
20.0000 mg | ORAL_TABLET | Freq: Once | ORAL | Status: AC
  Administered 2018-05-03: 20 mg via ORAL
  Filled 2018-05-03: qty 1
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
## 2018-05-04 ENCOUNTER — Ambulatory Visit: Payer: Managed Care, Other (non HMO) | Admitting: Cardiology

## 2018-05-09 ENCOUNTER — Ambulatory Visit: Payer: Managed Care, Other (non HMO) | Admitting: Family Medicine

## 2018-09-01 ENCOUNTER — Other Ambulatory Visit: Payer: Self-pay | Admitting: Obstetrics & Gynecology

## 2018-09-01 DIAGNOSIS — R928 Other abnormal and inconclusive findings on diagnostic imaging of breast: Secondary | ICD-10-CM

## 2018-09-07 ENCOUNTER — Ambulatory Visit
Admission: RE | Admit: 2018-09-07 | Discharge: 2018-09-07 | Disposition: A | Discharge status: Home / Self Care | Payer: Managed Care, Other (non HMO) | Source: Ambulatory Visit | Attending: Obstetrics & Gynecology | Admitting: Obstetrics & Gynecology

## 2018-09-07 ENCOUNTER — Other Ambulatory Visit: Payer: Self-pay

## 2018-09-07 DIAGNOSIS — R928 Other abnormal and inconclusive findings on diagnostic imaging of breast: Secondary | ICD-10-CM

## 2018-09-14 ENCOUNTER — Encounter: Payer: Self-pay | Admitting: Cardiology

## 2018-09-14 ENCOUNTER — Ambulatory Visit (INDEPENDENT_AMBULATORY_CARE_PROVIDER_SITE_OTHER): Payer: Managed Care, Other (non HMO) | Admitting: Cardiology

## 2018-09-14 ENCOUNTER — Other Ambulatory Visit: Payer: Self-pay

## 2018-09-14 ENCOUNTER — Telehealth: Payer: Self-pay | Admitting: *Deleted

## 2018-09-14 VITALS — BP 112/80 | HR 79 | Temp 97.9°F | Ht 64.0 in | Wt 177.0 lb

## 2018-09-14 DIAGNOSIS — Z8249 Family history of ischemic heart disease and other diseases of the circulatory system: Secondary | ICD-10-CM | POA: Diagnosis not present

## 2018-09-14 DIAGNOSIS — R002 Palpitations: Secondary | ICD-10-CM

## 2018-09-14 DIAGNOSIS — R400 Somnolence: Secondary | ICD-10-CM

## 2018-09-14 DIAGNOSIS — R079 Chest pain, unspecified: Secondary | ICD-10-CM

## 2018-09-14 NOTE — Patient Instructions (Addendum)
Medication Instructions:  NO CHANGES    Lab work: NOT NEEDED.  Testing/Procedures: Your physician has recommended that you wear a 14 DAY ZIO-PATCH monitor. The Zio patch cardiac monitor continuously records heart rhythm data for up to 14 days, this is for patients being evaluated for multiple types heart rhythms. For the first 24 hours post application, please avoid getting the Zio monitor wet in the shower or by excessive sweating during exercise. After that, feel free to carry on with regular activities. Keep soaps and lotions away from the ZIO XT Patch.  This will be placed at our [facility] location - [address].        Follow-Up: At [facility], you and your health needs are our priority.  As part of our continuing mission to provide you with exceptional heart care, we have created designated Provider Care Teams.  These Care Teams include your primary Cardiologist (physician) and Advanced Practice Providers (APPs -  Physician Assistants and Nurse Practitioners) who all work together to provide you with the care you need, when you need it. . You will need a follow up appointment in  2 months- CAN BE A VIRTUAL APPOINTMENT.   You may see Glenetta Hew, MD or one of the following Advanced Practice Providers on your designated Care Team:   . Rosaria Ferries, PA-C . Jory Sims, DNP, ANP  Any Other Special Instructions Will Be Listed Below.  Recommendations for vagal maneuvers:  "Bearing down"  Coughing  Gagging  Icy, cold towel on face or drink ice cold water
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
## 2018-09-14 NOTE — Telephone Encounter (Signed)
14 ZIO XT long term holter monitor to be mailed to patients home.  Instructions reviewed briefly as they are included in the monitor kit. 

## 2018-09-16 ENCOUNTER — Encounter: Payer: Self-pay | Admitting: Cardiology

## 2018-09-16 DIAGNOSIS — R400 Somnolence: Secondary | ICD-10-CM | POA: Insufficient documentation

## 2018-09-16 NOTE — Assessment & Plan Note (Signed)
Her palpitation episodes sound more like PACs or PVCs, but could also be short little bursts of PSVT.  We discussed vagal maneuvers very briefly. I would like to see what these episodes are and will have her wear a 14-day ZIO patch monitor.  Based on these results, we can do further evaluation or monitoring.  I would prefer not to treat unless there are major findings.

## 2018-09-16 NOTE — Assessment & Plan Note (Addendum)
She is not really complaining of chest pain.  More the palpitations and discomfort associated with them.  This can be discussed in follow-up.

## 2018-09-16 NOTE — Assessment & Plan Note (Addendum)
She talked a lot about her family history of heart disease.  I think maybe in follow-up we can Potentially discuss screening with coronary calcium score or similar study when I see her back in follow-up.  But for now we will simply evaluate her palpitations as it may lead to other testing.  Thankfully, her lipid panel is excellent with LDL of 30 with total cholesterol 109.

## 2018-09-16 NOTE — Assessment & Plan Note (Signed)
As part of her intake evaluation, she did her Epworth score which resulted in a score of 17 which is quite high.  (See review of systems section for results.  I will discuss her symptoms with her during follow-up visit and determine whether or not she would potentially benefit from polysomnogram.

## 2018-11-14 ENCOUNTER — Telehealth: Payer: Managed Care, Other (non HMO) | Admitting: Cardiology

## 2019-04-17 ENCOUNTER — Encounter: Payer: Self-pay | Admitting: *Deleted

## 2019-04-17 NOTE — Progress Notes (Signed)
Patient ID: Alison Sampson, female   DOB: Mar 22, 1970, 49 y.o.   MRN: [id number] ZIO patch monitor enrolled 09/14/2018, was returned to [facility] unused.  Order has been cancelled.

## 2019-08-15 ENCOUNTER — Ambulatory Visit (INDEPENDENT_AMBULATORY_CARE_PROVIDER_SITE_OTHER): Payer: Managed Care, Other (non HMO) | Admitting: Nurse Practitioner

## 2019-08-15 ENCOUNTER — Other Ambulatory Visit: Payer: Self-pay

## 2019-08-15 ENCOUNTER — Encounter: Payer: Self-pay | Admitting: Nurse Practitioner

## 2019-08-15 VITALS — BP 122/76 | Ht 64.0 in | Wt 176.0 lb

## 2019-08-15 DIAGNOSIS — N920 Excessive and frequent menstruation with regular cycle: Secondary | ICD-10-CM

## 2019-08-15 DIAGNOSIS — Z90721 Acquired absence of ovaries, unilateral: Secondary | ICD-10-CM | POA: Insufficient documentation

## 2019-08-15 DIAGNOSIS — Z01419 Encounter for gynecological examination (general) (routine) without abnormal findings: Secondary | ICD-10-CM

## 2019-08-15 NOTE — Progress Notes (Signed)
   [place] 28-May-1970 [id number]   History:  49 y.o. G3P1003 presents to establish care. Complains of irregular cycles that began 6-12 months ago. They occur monthly but last from 1-3 weeks. She is interested in endometrial ablation. She does have night sweats but is not sure if this is related to anxiety. 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 She sees therapy every couple of weeks for this. Left tube and ovary removed due to ectopic pregnancy, tubal ligation on right. Has had a 50 pound weight gain in 2 years and is being see at [facility]. She had labs done today and she will get those sent to [place].   Gynecologic History Patient's last menstrual period was 08/04/2019. Period Duration (Days): 7 Period Pattern: (!) Irregular Menstrual Flow: Heavy Dysmenorrhea: (!) Moderate Dysmenorrhea Symptoms: Cramping Contraception: tubal ligation Last Pap: 09/07/2018. Results were: normal Last mammogram: 09/07/2018. Results were: Benign right breast cysts, likely oil cysts from fat necrosis. Negative for suspicions of malignancy    Past medical history, past surgical history, family history and social history were all reviewed and documented in the EPIC chart.  ROS:  A ROS was performed and pertinent positives and negatives are included.  Exam:  Vitals:   08/15/19 0838  BP: 122/76  Weight: 176 lb (79.8 kg)  Height: 5\' 4"  (1.626 m)   Body mass index is 30.21 kg/m.  General appearance:  Normal Thyroid:  Symmetrical, normal in size, without palpable masses or nodularity. Respiratory  Auscultation:  Clear without wheezing or rhonchi Cardiovascular  Auscultation:  Regular rate, without rubs, murmurs or gallops  Edema/varicosities:  Not grossly evident Abdominal  Soft,nontender, without masses, guarding or rebound.  Liver/spleen:  No organomegaly noted  Hernia:  None appreciated  Skin  Inspection:  Grossly normal   Breasts: Examined lying and sitting.   Right: Without masses,  retractions, discharge or axillary adenopathy.   Left: Without masses, retractions, discharge or axillary adenopathy. Gentitourinary   Inguinal/mons:  Normal without inguinal adenopathy  External genitalia:  Normal  BUS/Urethra/Skene's glands:  Normal  Vagina:  Normal  Cervix:  Normal  Uterus:  Anteverted, normal in size, shape and contour.  Midline and mobile  Adnexa/parametria:     Rt: Without masses or tenderness.   Lt: Without masses or tenderness.  Anus and perineum: Normal   Assessment/Plan:  49 y.o. [id number] to establish care.  Well female exam with routine gynecological exam - Education provided on SBEs, importance of preventative screenings, current guidelines, high calcium diet, regular exercise, and multivitamin daily. Discussed what to expect with menopause.   Menorrhagia with regular cycle - Plan: US PELVIS TRANSVAGINAL NON-OB (TV ONLY). To rule out uterine abnormalities that may be causing heavy, prolonged bleeding. If normal ultrasound we will consider endometrial ablation.   Follow up in 1 year for annual        Ohlman, 8:54 AM 08/15/2019

## 2019-08-15 NOTE — Patient Instructions (Signed)
Health Maintenance, Female Adopting a healthy lifestyle and getting preventive care are important in promoting health and wellness. Ask your health care provider about:  The right schedule for you to have regular tests and exams.  Things you can do on your own to prevent diseases and keep yourself healthy. What should I know about diet, weight, and exercise? Eat a healthy diet   Eat a diet that includes plenty of vegetables, fruits, low-fat dairy products, and lean protein.  Do not eat a lot of foods that are high in solid fats, added sugars, or sodium. Maintain a healthy weight Body mass index (BMI) is used to identify weight problems. It estimates body fat based on height and weight. Your health care provider can help determine your BMI and help you achieve or maintain a healthy weight. Get regular exercise Get regular exercise. This is one of the most important things you can do for your health. Most adults should:  Exercise for at least 150 minutes each week. The exercise should increase your heart rate and make you sweat (moderate-intensity exercise).  Do strengthening exercises at least twice a week. This is in addition to the moderate-intensity exercise.  Spend less time sitting. Even light physical activity can be beneficial. Watch cholesterol and blood lipids Have your blood tested for lipids and cholesterol at 49 years of age, then have this test every 5 years. Have your cholesterol levels checked more often if:  Your lipid or cholesterol levels are high.  You are older than 49 years of age.  You are at high risk for heart disease. What should I know about cancer screening? Depending on your health history and family history, you may need to have cancer screening at various ages. This may include screening for:  Breast cancer.  Cervical cancer.  Colorectal cancer.  Skin cancer.  Lung cancer. What should I know about heart disease, diabetes, and high blood  pressure? Blood pressure and heart disease  High blood pressure causes heart disease and increases the risk of stroke. This is more likely to develop in people who have high blood pressure readings, are of African descent, or are overweight.  Have your blood pressure checked: ? Every 3-5 years if you are 18-39 years of age. ? Every year if you are 40 years old or older. Diabetes Have regular diabetes screenings. This checks your fasting blood sugar level. Have the screening done:  Once every three years after age 40 if you are at a normal weight and have a low risk for diabetes.  More often and at a younger age if you are overweight or have a high risk for diabetes. What should I know about preventing infection? Hepatitis B If you have a higher risk for hepatitis B, you should be screened for this virus. Talk with your health care provider to find out if you are at risk for hepatitis B infection. Hepatitis C Testing is recommended for:  Everyone born from 1945 through 1965.  Anyone with known risk factors for hepatitis C. Sexually transmitted infections (STIs)  Get screened for STIs, including gonorrhea and chlamydia, if: ? You are sexually active and are younger than 49 years of age. ? You are older than 49 years of age and your health care provider tells you that you are at risk for this type of infection. ? Your sexual activity has changed since you were last screened, and you are at increased risk for chlamydia or gonorrhea. Ask your health care provider if   you are at risk.  Ask your health care provider about whether you are at high risk for HIV. Your health care provider may recommend a prescription medicine to help prevent HIV infection. If you choose to take medicine to prevent HIV, you should first get tested for HIV. You should then be tested every 3 months for as long as you are taking the medicine. Pregnancy  If you are about to stop having your period (premenopausal) and  you may become pregnant, seek counseling before you get pregnant.  Take 400 to 800 micrograms (mcg) of folic acid every day if you become pregnant.  Ask for birth control (contraception) if you want to prevent pregnancy. Osteoporosis and menopause Osteoporosis is a disease in which the bones lose minerals and strength with aging. This can result in bone fractures. If you are 65 years old or older, or if you are at risk for osteoporosis and fractures, ask your health care provider if you should:  Be screened for bone loss.  Take a calcium or vitamin D supplement to lower your risk of fractures.  Be given hormone replacement therapy (HRT) to treat symptoms of menopause. Follow these instructions at home: Lifestyle  Do not use any products that contain nicotine or tobacco, such as cigarettes, e-cigarettes, and chewing tobacco. If you need help quitting, ask your health care provider.  Do not use street drugs.  Do not share needles.  Ask your health care provider for help if you need support or information about quitting drugs. Alcohol use  Do not drink alcohol if: ? Your health care provider tells you not to drink. ? You are pregnant, may be pregnant, or are planning to become pregnant.  If you drink alcohol: ? Limit how much you use to 0-1 drink a day. ? Limit intake if you are breastfeeding.  Be aware of how much alcohol is in your drink. In the [place] one drink equals one 12 oz bottle of beer (355 mL), one 5 oz glass of wine (148 mL), or one 1 oz glass of hard liquor (44 mL). General instructions  Schedule regular health, dental, and eye exams.  Stay current with your vaccines.  Tell your health care provider if: ? You often feel depressed. ? You have ever been abused or do not feel safe at home. Summary  Adopting a healthy lifestyle and getting preventive care are important in promoting health and wellness.  Follow your health care provider's instructions about healthy  diet, exercising, and getting tested or screened for diseases.  Follow your health care provider's instructions on monitoring your cholesterol and blood pressure. This information is not intended to replace advice given to you by your health care provider. Make sure you discuss any questions you have with your health care provider. Document Revised: 01/04/2018 Document Reviewed: 01/04/2018 Elsevier Patient Education  2020 Elsevier Inc.  

## 2019-08-22 ENCOUNTER — Ambulatory Visit (INDEPENDENT_AMBULATORY_CARE_PROVIDER_SITE_OTHER): Payer: Managed Care, Other (non HMO)

## 2019-08-22 ENCOUNTER — Other Ambulatory Visit: Payer: Self-pay

## 2019-08-22 ENCOUNTER — Encounter: Payer: Self-pay | Admitting: Nurse Practitioner

## 2019-08-22 ENCOUNTER — Ambulatory Visit (INDEPENDENT_AMBULATORY_CARE_PROVIDER_SITE_OTHER): Payer: Managed Care, Other (non HMO) | Admitting: Nurse Practitioner

## 2019-08-22 VITALS — BP 130/80

## 2019-08-22 DIAGNOSIS — N854 Malposition of uterus: Secondary | ICD-10-CM | POA: Diagnosis not present

## 2019-08-22 DIAGNOSIS — N920 Excessive and frequent menstruation with regular cycle: Secondary | ICD-10-CM

## 2019-08-22 DIAGNOSIS — D251 Intramural leiomyoma of uterus: Secondary | ICD-10-CM

## 2019-08-22 DIAGNOSIS — D259 Leiomyoma of uterus, unspecified: Secondary | ICD-10-CM

## 2019-08-22 DIAGNOSIS — D252 Subserosal leiomyoma of uterus: Secondary | ICD-10-CM | POA: Diagnosis not present

## 2019-08-22 NOTE — Patient Instructions (Addendum)
Endometrial Ablation Endometrial ablation is a procedure that destroys the thin inner layer of the lining of the uterus (endometrium). This procedure may be done:  To stop heavy periods.  To stop bleeding that is causing anemia.  To control irregular bleeding.  To treat bleeding caused by small tumors (fibroids) in the endometrium. This procedure is often an alternative to major surgery, such as removal of the uterus and cervix (hysterectomy). As a result of this procedure:  You may not be able to have children. However, if you are premenopausal (you have not gone through menopause): ? You may still have a small chance of getting pregnant. ? You will need to use a reliable method of birth control after the procedure to prevent pregnancy.  You may stop having a menstrual period, or you may have only a small amount of bleeding during your period. Menstruation may return several years after the procedure. Tell a health care provider about:  Any allergies you have.  All medicines you are taking, including vitamins, herbs, eye drops, creams, and over-the-counter medicines.  Any problems you or family members have had with the use of anesthetic medicines.  Any blood disorders you have.  Any surgeries you have had.  Any medical conditions you have. What are the risks? Generally, this is a safe procedure. However, problems may occur, including:  A hole (perforation) in the uterus or bowel.  Infection of the uterus, bladder, or vagina.  Bleeding.  Damage to other structures or organs.  An air bubble in the lung (air embolus).  Problems with pregnancy after the procedure.  Failure of the procedure.  Decreased ability to diagnose cancer in the endometrium. What happens before the procedure?  You will have tests of your endometrium to make sure there are no pre-cancerous cells or cancer cells present.  You may have an ultrasound of the uterus.  You may be given medicines to  thin the endometrium.  Ask your health care provider about: ? Changing or stopping your regular medicines. This is especially important if you take diabetes medicines or blood thinners. ? Taking medicines such as aspirin and ibuprofen. These medicines can thin your blood. Do not take these medicines before your procedure if your doctor tells you not to.  Plan to have someone take you home from the hospital or clinic. What happens during the procedure?   You will lie on an exam table with your feet and legs supported as in a pelvic exam.  To lower your risk of infection: ? Your health care team will wash or sanitize their hands and put on germ-free (sterile) gloves. ? Your genital area will be washed with soap.  An IV tube will be inserted into one of your veins.  You will be given a medicine to help you relax (sedative).  A surgical instrument with a light and camera (resectoscope) will be inserted into your vagina and moved into your uterus. This allows your surgeon to see inside your uterus.  Endometrial tissue will be removed using one of the following methods: ? Radiofrequency. This method uses a radiofrequency-alternating electric current to remove the endometrium. ? Cryotherapy. This method uses extreme cold to freeze the endometrium. ? Heated-free liquid. This method uses a heated saltwater (saline) solution to remove the endometrium. ? Microwave. This method uses high-energy microwaves to heat up the endometrium and remove it. ? Thermal balloon. This method involves inserting a catheter with a balloon tip into the uterus. The balloon tip is filled with   heated fluid to remove the endometrium. The procedure may vary among health care providers and hospitals. What happens after the procedure?  Your blood pressure, heart rate, breathing rate, and blood oxygen level will be monitored until the medicines you were given have worn off.  As tissue healing occurs, you may notice  vaginal bleeding for 4-6 weeks after the procedure. You may also experience: ? Cramps. ? Thin, watery vaginal discharge that is light pink or brown in color. ? A need to urinate more frequently than usual. ? Nausea.  Do not drive for 24 hours if you were given a sedative.  Do not have sex or insert anything into your vagina until your health care provider approves. Summary  Endometrial ablation is done to treat the many causes of heavy menstrual bleeding.  The procedure may be done only after medications have been tried to control the bleeding.  Plan to have someone take you home from the hospital or clinic. This information is not intended to replace advice given to you by your health care provider. Make sure you discuss any questions you have with your health care provider. Document Revised: 06/28/2017 Document Reviewed: 01/29/2016 Elsevier Patient Education  [place].  Uterine Fibroids  Uterine fibroids (leiomyomas) are noncancerous (benign) tumors that can develop in the uterus. Fibroids may also develop in the fallopian tubes, cervix, or tissues (ligaments) near the uterus. You may have one or many fibroids. Fibroids vary in size, weight, and where they grow in the uterus. Some can become quite large. Most fibroids do not require medical treatment. What are the causes? The cause of this condition is not known. What increases the risk? You are more likely to develop this condition if you:  Are in your 30s or 40s and have not gone through menopause.  Have a family history of this condition.  Are of African-American descent.  Had your first period at an early age (early menarche).  Have not had any children (nulliparity).  Are overweight or obese. What are the signs or symptoms? Many women do not have any symptoms. Symptoms of this condition may include:  Heavy menstrual bleeding.  Bleeding or spotting between periods.  Pain and pressure in the pelvic area,  between the hips.  Bladder problems, such as needing to urinate urgently or more often than usual.  Inability to have children (infertility).  Failure to carry pregnancy to term (miscarriage). How is this diagnosed? This condition may be diagnosed based on:  Your symptoms and medical history.  A physical exam.  A pelvic exam that includes feeling for any tumors.  Imaging tests, such as ultrasound or MRI. How is this treated? Treatment for this condition may include:  Seeing your health care provider for follow-up visits to monitor your fibroids for any changes.  Taking NSAIDs such as ibuprofen, naproxen, or aspirin to reduce pain.  Hormone medicines. These may be taken as a pill, given in an injection, or delivered by a T-shaped device that is inserted into the uterus (intrauterine device, IUD).  Surgery to remove one of the following: ? The fibroids (myomectomy). Your health care provider may recommend this if fibroids affect your fertility and you want to become pregnant. ? The uterus (hysterectomy). ? Blood supply to the fibroids (uterine artery embolization). Follow these instructions at home:  Take over-the-counter and prescription medicines only as told by your health care provider.  Ask your health care provider if you should take iron pills or eat more iron-rich foods,  such as dark green, leafy vegetables. Heavy menstrual bleeding can cause low iron levels.  If directed, apply heat to your back or abdomen to reduce pain. Use the heat source that your health care provider recommends, such as a moist heat pack or a heating pad. ? Place a towel between your skin and the heat source. ? Leave the heat on for 20-30 minutes. ? Remove the heat if your skin turns bright red. This is especially important if you are unable to feel pain, heat, or cold. You may have a greater risk of getting burned.  Pay close attention to your menstrual cycle. Tell your health care provider about  any changes, such as: ? Increased blood flow that requires you to use more pads or tampons than usual. ? A change in the number of days that your period lasts. ? A change in symptoms that are associated with your period, such as back pain or cramps in your abdomen.  Keep all follow-up visits as told by your health care provider. This is important, especially if your fibroids need to be monitored for any changes. Contact a health care provider if you:  Have pelvic pain, back pain, or cramps in your abdomen that do not get better with medicine or heat.  Develop new bleeding between periods.  Have increased bleeding during or between periods.  Feel unusually tired or weak.  Feel light-headed. Get help right away if you:  Faint.  Have pelvic pain that suddenly gets worse.  Have severe vaginal bleeding that soaks a tampon or pad in 30 minutes or less. Summary  Uterine fibroids are noncancerous (benign) tumors that can develop in the uterus.  The exact cause of this condition is not known.  Most fibroids do not require medical treatment unless they affect your ability to have children (fertility).  Contact a health care provider if you have pelvic pain, back pain, or cramps in your abdomen that do not get better with medicines.  Make sure you know what symptoms should cause you to get help right away. This information is not intended to replace advice given to you by your health care provider. Make sure you discuss any questions you have with your health care provider. Document Revised: 12/24/2016 Document Reviewed: 12/07/2016 Elsevier Patient Education  2020 [employer].

## 2019-08-22 NOTE — Progress Notes (Signed)
History: 49 year old presents today to discuss ultrasound.  Was seen by me 08/15/2019 with complaints of irregular cycles that occur monthly but last 1 to 3 weeks and range from heavy bleeding with clots to light bleeding.  These changes began 6 to 12 months ago.  She is interested in an endometrial ablation.  Left tube removed due to ectopic pregnancy, tubal ligation on right.   Exam: Appears well Ultrasound: Retroverted uterus - retroverted at C-section scar, normal size, several intramural and subserous fibroids #6 largest measuring 2 cm.  Thin, symmetrical endometrium 4.14 mm, cavity slightly distorted by adjacent fibroids.  Left ovary with normal follicle pattern -adherent to left uterine sidewall, tender to palpation. Right ovary mobile with normal follicle pattern 25 x 16 mm resolving C.L..  No adnexal masses, small amount of free fluid in right adnexa.  Assessment: Menorrhagia with regular cycle Leiomyoma of body of uterus #6  Plan: Discussed ultrasound findings and possible difficulty performing endometrial ablation due to fibroids and distorted uterus. Also discussed IUD as a treatment option but insertion may be difficult. Spoke with Dr. Delilah Shan about ultrasound findings and he would like a follow up with her for extensive discussion and review of ultrasound. She is aware that if endometrial ablation is recommended an endometrial biopsy will be performed at that follow up to rule out hyperplasia. Education provided on what to expect with biopsy and endometrial ablation. She does not want to do an IUD at this time. Instructed to take Ibuprofen an hour prior to appointment for post procedure cramping. She is agreeable to plan.

## 2019-08-29 ENCOUNTER — Ambulatory Visit: Payer: Managed Care, Other (non HMO) | Admitting: Obstetrics and Gynecology

## 2019-08-29 DIAGNOSIS — Z0289 Encounter for other administrative examinations: Secondary | ICD-10-CM

## 2019-08-30 ENCOUNTER — Telehealth: Payer: Self-pay

## 2019-08-30 NOTE — Telephone Encounter (Signed)
Patient dropped off lab results from weight doctor she was seeing in [place]. Would like Dr. Raoul Pitch to review them, she was told to see endocrinologist OR may need to see her PCP for a referral first. She is okay with whatever. Just wants some insight because her weight has not gone anywhere but up over the last year and she is literally only eating about 1200 -1300 calories daily.   Please call Abbigayle at [phone]

## 2019-09-03 NOTE — Telephone Encounter (Signed)
Unable to locate form in nurses station or provider desk.

## 2019-09-04 NOTE — Telephone Encounter (Signed)
Form placed on PCP desk for review

## 2019-09-06 NOTE — Telephone Encounter (Signed)
Patient has been seen once by this provider for new patient establishment 1.5 years ago. Please schedule her for an appointment if she would like to discuss weight management and her labs that [facility]. Thanks.

## 2019-09-24 ENCOUNTER — Ambulatory Visit: Payer: Managed Care, Other (non HMO) | Admitting: Family Medicine

## 2019-10-09 ENCOUNTER — Encounter: Payer: Self-pay | Admitting: Family Medicine

## 2019-10-09 ENCOUNTER — Ambulatory Visit (INDEPENDENT_AMBULATORY_CARE_PROVIDER_SITE_OTHER): Payer: Managed Care, Other (non HMO) | Admitting: Family Medicine

## 2019-10-09 ENCOUNTER — Other Ambulatory Visit: Payer: Self-pay

## 2019-10-09 VITALS — BP 117/82 | HR 86 | Temp 98.1°F | Resp 16 | Wt 170.4 lb

## 2019-10-09 DIAGNOSIS — E559 Vitamin D deficiency, unspecified: Secondary | ICD-10-CM | POA: Diagnosis not present

## 2019-10-09 DIAGNOSIS — E079 Disorder of thyroid, unspecified: Secondary | ICD-10-CM | POA: Diagnosis not present

## 2019-10-09 DIAGNOSIS — E119 Type 2 diabetes mellitus without complications: Secondary | ICD-10-CM

## 2019-10-09 DIAGNOSIS — Z23 Encounter for immunization: Secondary | ICD-10-CM

## 2019-10-09 DIAGNOSIS — E663 Overweight: Secondary | ICD-10-CM

## 2019-10-09 DIAGNOSIS — E611 Iron deficiency: Secondary | ICD-10-CM

## 2019-10-09 LAB — BASIC METABOLIC PANEL
BUN: 12 mg/dL (ref 6–23)
CO2: 26 mEq/L (ref 19–32)
Calcium: 9.6 mg/dL (ref 8.4–10.5)
Chloride: 103 mEq/L (ref 96–112)
Creatinine, Ser: 0.65 mg/dL (ref 0.40–1.20)
GFR: 97 mL/min (ref >60.00)
Glucose, Bld: 92 mg/dL (ref 70–99)
Potassium: 4 mEq/L (ref 3.5–5.1)
Sodium: 138 mEq/L (ref 135–145)

## 2019-10-09 LAB — VITAMIN D 25 HYDROXY (VIT D DEFICIENCY, FRACTURES): VITD: 36.39 ng/mL (ref 30.00–100.00)

## 2019-10-09 LAB — HEMOGLOBIN A1C: Hgb A1c MFr Bld: 6.5 % (ref 4.6–6.5)

## 2019-10-09 LAB — TSH: TSH: 3.17 u[IU]/mL (ref 0.35–4.50)

## 2019-10-09 LAB — T4, FREE: Free T4: 0.72 ng/dL (ref 0.60–1.60)

## 2019-10-09 LAB — T3, FREE: T3, Free: 3 pg/mL (ref 2.3–4.2)
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
## 2019-10-09 NOTE — Patient Instructions (Signed)
Once we get your labs back we will discuss medications and follow up.   Continue to hydrate > 80  Ounces a day Continue to follow low glycemic index diet.  Heart rate with exercise for best fat burning potential about 140 bpm (at least 150 minutes a week)  Follow calorie calculator. Net for daily caloric needs>>> but NEVER lower than 1200/cal day.    It was great to see you today.

## 2019-10-09 NOTE — Progress Notes (Signed)
Pt states she is already with weight clinic. Just recently found out she is diabetic. Everything she is trying is not helping with weight. A1C 6.8 fasting [id number].

## 2019-10-10 ENCOUNTER — Telehealth: Payer: Self-pay | Admitting: Family Medicine

## 2019-10-10 DIAGNOSIS — E119 Type 2 diabetes mellitus without complications: Secondary | ICD-10-CM

## 2019-10-10 LAB — IRON,TIBC AND FERRITIN PANEL
%SAT: 30 % (calc) (ref 16–45)
Ferritin: 11 ng/mL — ABNORMAL LOW (ref 16–232)
Iron: 119 ug/dL (ref 40–190)
TIBC: 394 mcg/dL (calc) (ref 250–450)

## 2019-10-10 MED ORDER — GLIPIZIDE 5 MG PO TABS: ORAL_TABLET | ORAL | 1 refills | Status: DC

## 2019-10-10 NOTE — Telephone Encounter (Addendum)
please call patient: Her electrolytes, kidney function and glucose levels are normal. Her A1c is now down to 6.5-and a normal glucose at 92. Her thyroid panel (TSH, T3, T4) are all normal. Her iron levels in her blood are normal at 119, but her ferritin levels are still low at 11 (it was 22 last year when collected March 2020).  Please ask her how much iron she is currently taking, so that we can guide her if she desires on iron supplementation.  If she would instead like to continue receiving guidance on her iron from her integrative medicine doctors that is certainly okay to.  Please fax them a copy of her result, if  she gives permission.  She expressed concerns over the Metformin prescribed by another provider.  We could try a very low-dose medicine called glipizide that she would take half a tab before meal daily.  The concern with glipizide is that it does  lower her sugars directly and she had a normal sugar  in the office.  So this medication would definitely have to be taken before meal.   She had been monitoring her sugars.  I would encourage her to continue monitoring her sugars with a fasting blood glucose in the morning and a random blood glucose in the afternoon.  If she needs blood glucose testing supplies please call in generic/formulary supplies for her.  Follow-up 3 months.

## 2019-10-11 NOTE — Telephone Encounter (Signed)
LM for pt to returncall

## 2019-10-17 ENCOUNTER — Encounter: Payer: Self-pay | Admitting: Family Medicine

## 2019-10-17 DIAGNOSIS — E663 Overweight: Secondary | ICD-10-CM | POA: Insufficient documentation

## 2020-03-17 ENCOUNTER — Other Ambulatory Visit: Payer: Self-pay

## 2020-03-17 ENCOUNTER — Ambulatory Visit: Payer: Managed Care, Other (non HMO)

## 2020-03-17 ENCOUNTER — Encounter: Payer: Self-pay | Admitting: Podiatry

## 2020-03-17 ENCOUNTER — Ambulatory Visit (INDEPENDENT_AMBULATORY_CARE_PROVIDER_SITE_OTHER): Payer: Managed Care, Other (non HMO) | Admitting: Podiatry

## 2020-03-17 DIAGNOSIS — M79672 Pain in left foot: Secondary | ICD-10-CM

## 2020-03-17 DIAGNOSIS — B351 Tinea unguium: Secondary | ICD-10-CM | POA: Diagnosis not present

## 2020-03-17 DIAGNOSIS — L600 Ingrowing nail: Secondary | ICD-10-CM

## 2020-03-17 DIAGNOSIS — M79671 Pain in right foot: Secondary | ICD-10-CM

## 2020-03-18 NOTE — Progress Notes (Signed)
Subjective:   Patient ID: Alison Sampson, female   DOB: 50 y.o.   MRN: [id number]   HPI Patient presents stating that she has great concerns about big toenail disease and discoloration and is concerned about long-term possibilities or problems.  Patient does not smoke likes to be active.  Also has moderate bunion deformity bilateral   Review of Systems  All other systems reviewed and are negative.       Objective:  Physical Exam Vitals and nursing note reviewed.  Constitutional:      Appearance: She is well-developed and well-nourished.  Cardiovascular:     Pulses: Intact distal pulses.  Pulmonary:     Effort: Pulmonary effort is normal.  Musculoskeletal:        General: Normal range of motion.  Skin:    General: Skin is warm.  Neurological:     Mental Status: She is alert.     Neurovascular status found to be intact muscle strength is adequate range of motion was adequate.  Patient has significant discoloration hallux nails bilateral with distal two thirds of the nailbeds that are cracked bilateral with no indications of drainage no indications of other pathology.  There is moderate erythema in the area of localized and patient does have good digital perfusion.  I also noted moderate bunion deformity and I did note that there is keratotic lesion hallux bilateral     Assessment:  Probability that this is trauma versus fungus or other systemic pathology with structural changes to the digits secondary to structural bunion     Plan:  H&P reviewed condition with patient.  Spent a great deal of time educating her on nail disease ingrown toenails and consideration for removal educated her on permanent removal of problem.  Since pain is not significant at this point we will just go ahead and utilize soaks trimming and padding as needed and decide whether anything else in the long-term will be necessary.  Reviewed bunions discussed possible correction debrided lesions padded and it can be  done as needed  X-rays indicate moderate structural bunion deformity no other pathology noted with mild deformity of the hallux themselves

## 2021-06-01 IMAGING — MG DIGITAL DIAGNOSTIC UNILATERAL RIGHT MAMMOGRAM WITH TOMO AND CAD
4 series · 4 of 12 positions shown · non-contrast
Comparison: Previous exam(s).

CLINICAL DATA: Screening recall for possible masses in the right
breast. Patient has a history of previous bilateral reduction
mammoplasty.

EXAM:
DIGITAL DIAGNOSTIC RIGHT MAMMOGRAM WITH CAD AND TOMO
ULTRASOUND RIGHT BREAST

[R CC synth-2D]
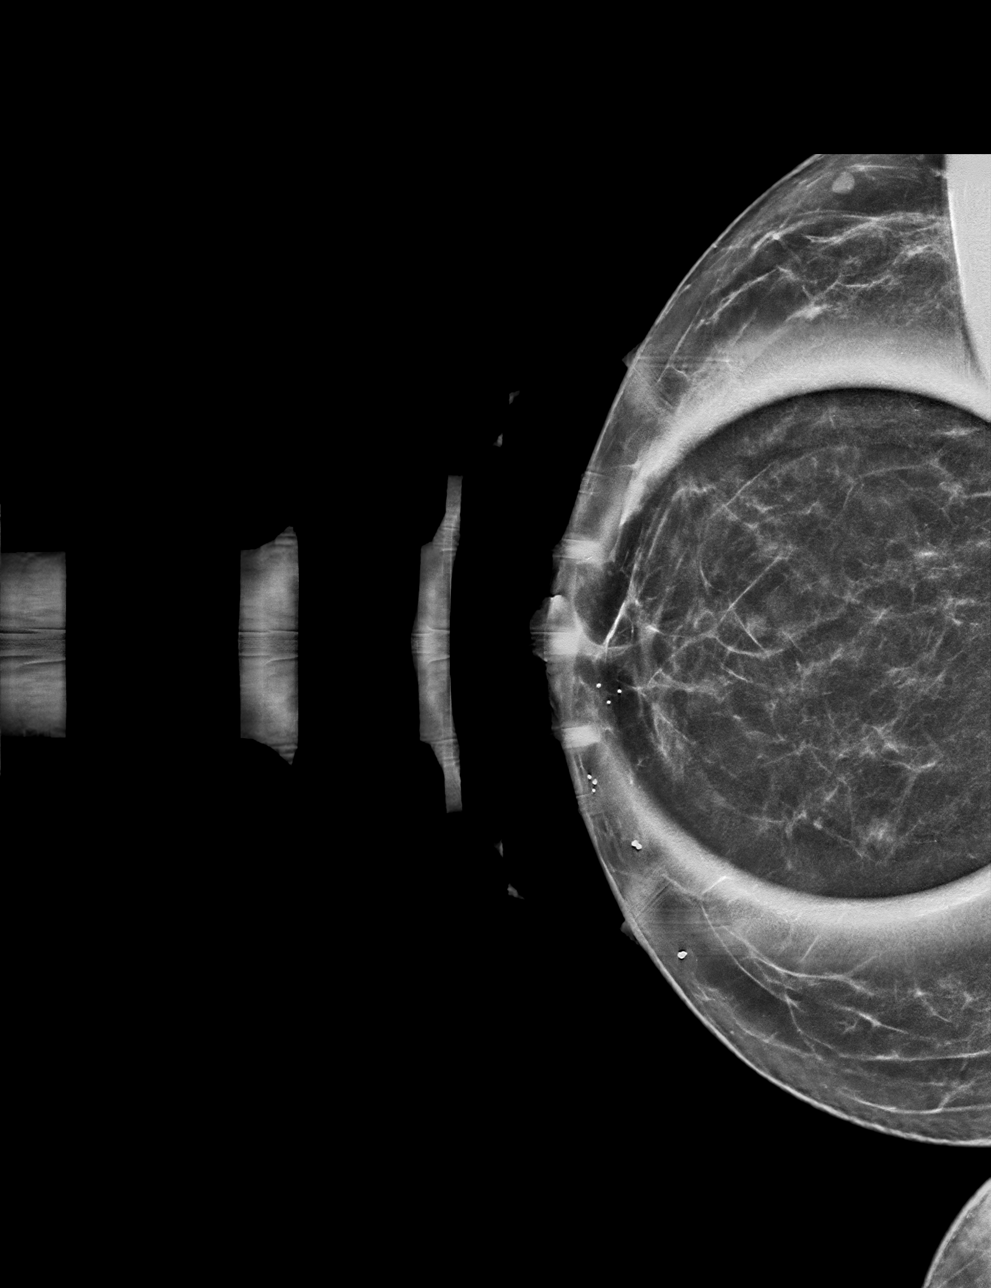

[R MLO synth-2D]
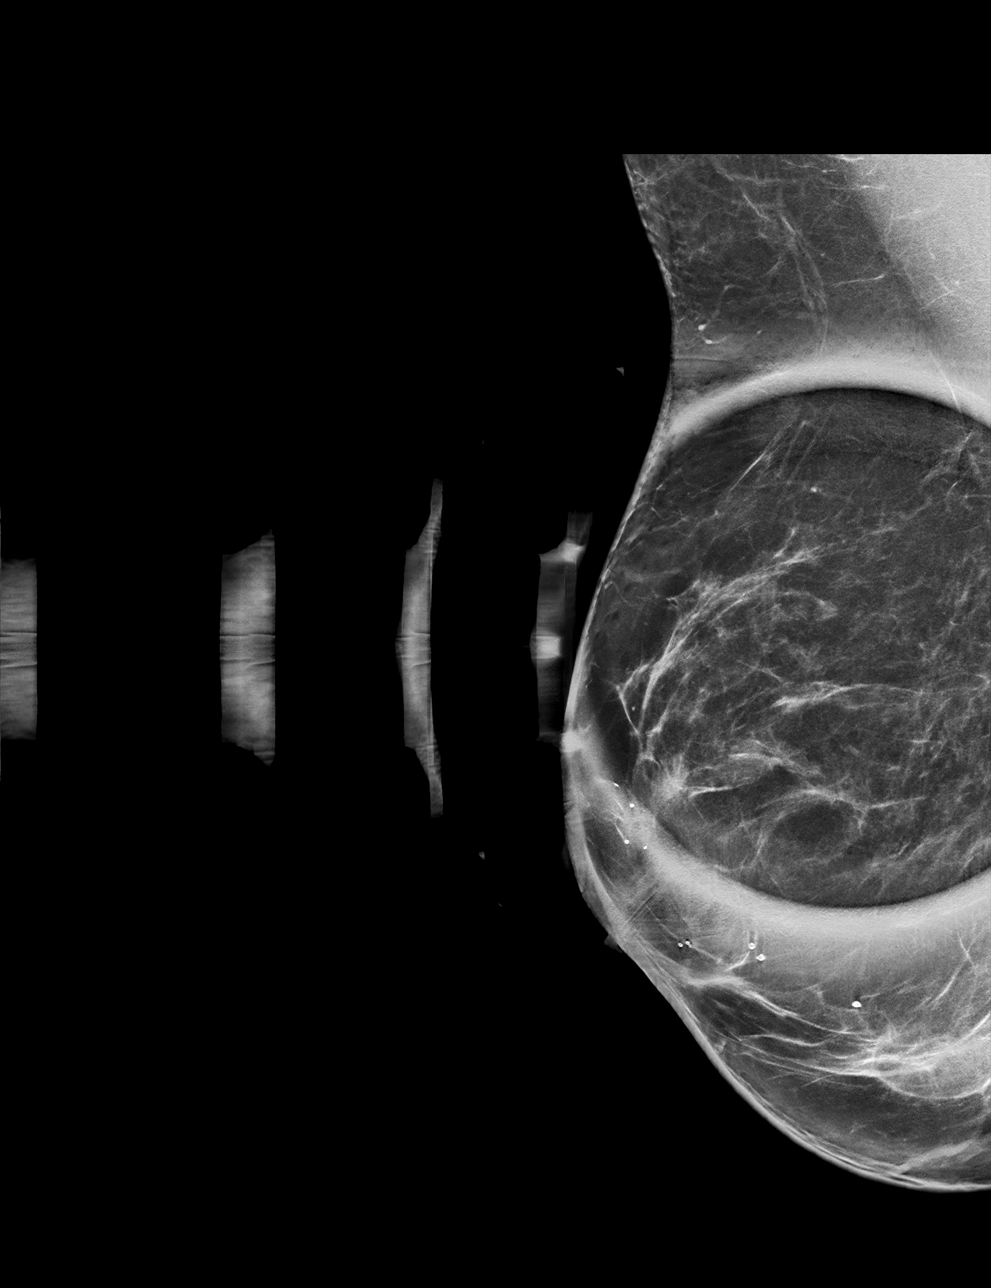

[R MLO tomo · tomo slice 39/76.0]
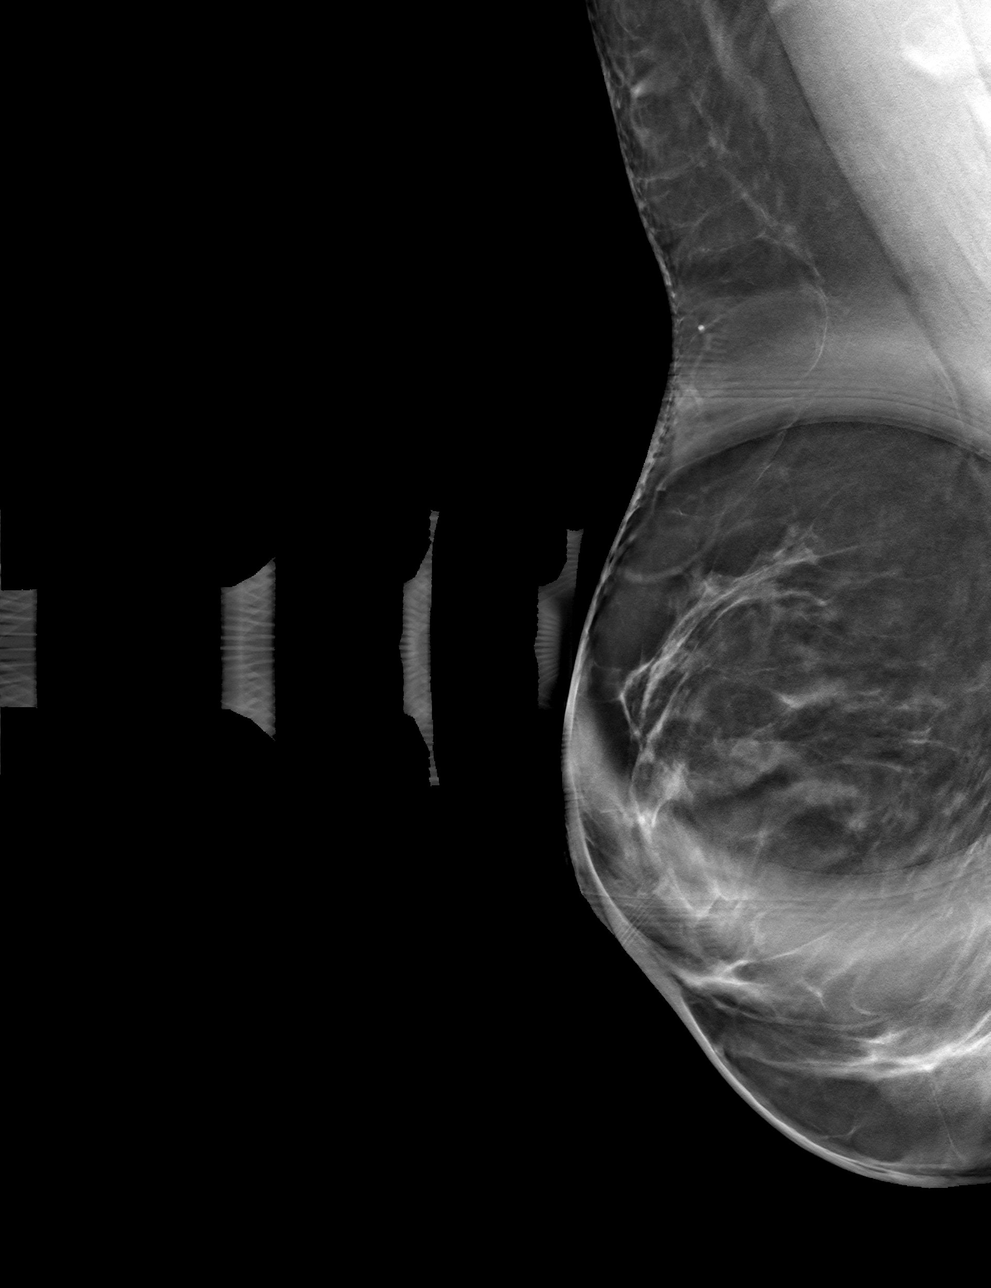

[R CC tomo · tomo slice 33/65.0]
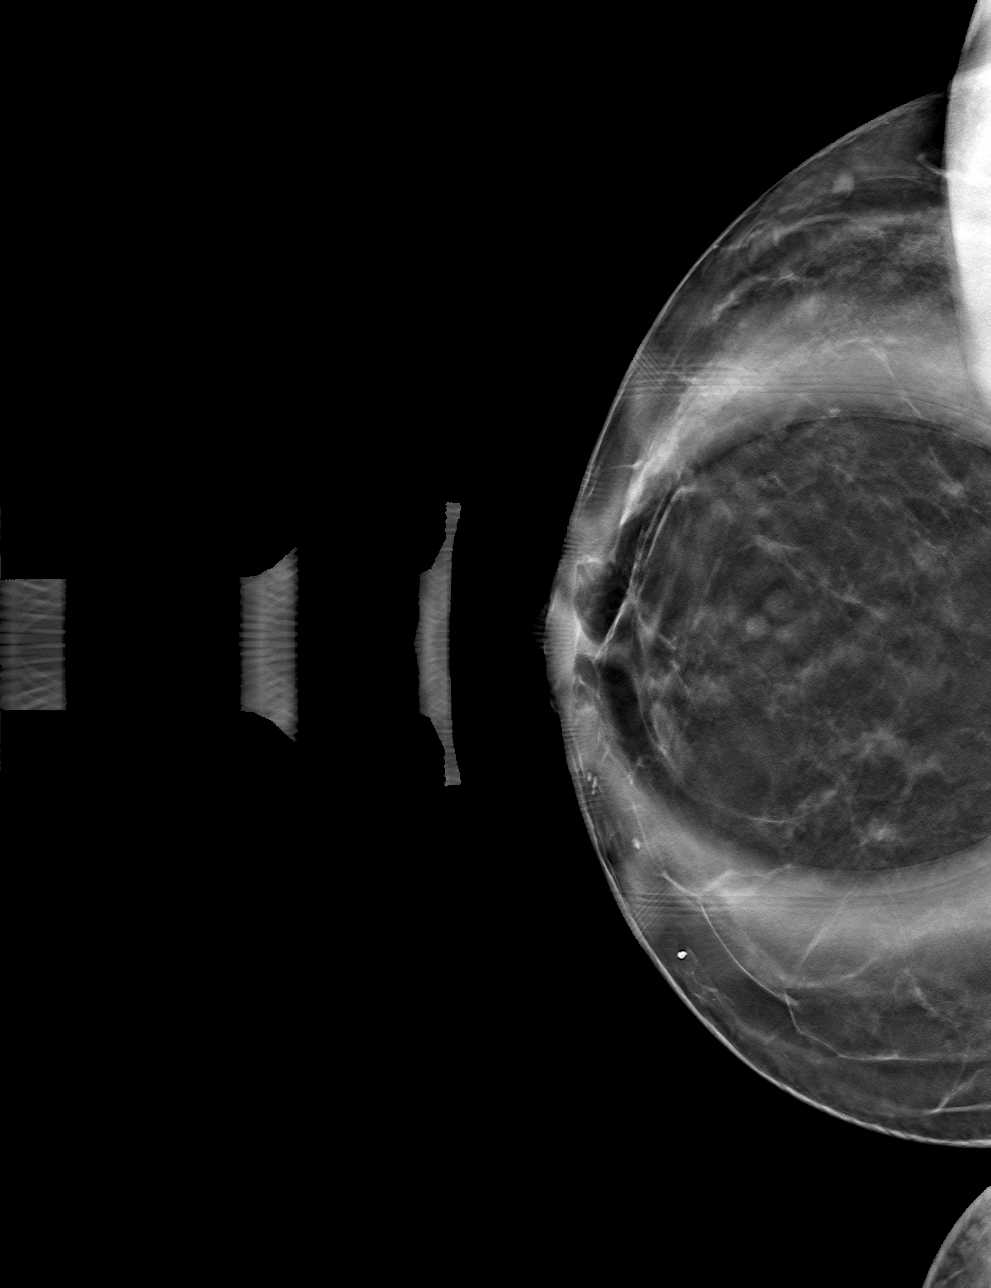

[4 of 12 positions shown; findings below may reference images not displayed]

ACR Breast Density Category b: There are scattered areas of
fibroglandular density.
FINDINGS: On the spot compression images, possible masses persist. These round
circumscribed masses, 3 in close approximation, in the retroareolar
right breast. The largest measures 5 mm in diameter.

Mammographic images were processed with CAD.

Targeted ultrasound is performed, showing several cysts in the
retroareolar right breast, slightly towards the 11 o'clock position,
the largest measuring 5 mm, which account for the mammographic
masses. There is additional superficial cyst abutting the dermis at
12 o'clock, retroareolar, measuring 7 mm in greatest dimension. No
solid or suspicious masses or lesions.
IMPRESSION: 1. No evidence of breast malignancy.
2. Benign right breast cysts, likely oil cysts from fat necrosis
given the periareolar incision from the previous reduction
mammoplasty.

RECOMMENDATION:
Screening mammogram in one year.(Code:YC-B-IDG)

I have discussed the findings and recommendations with the patient.
Results were also provided in writing at the conclusion of the
visit. If applicable, a reminder letter will be sent to the patient
regarding the next appointment.

BI-RADS CATEGORY  2: Benign.

## 2021-06-01 IMAGING — US ULTRASOUND RIGHT BREAST LIMITED
1 series · 13 of 14 positions shown · non-contrast
Comparison: Previous exam(s).

CLINICAL DATA: Screening recall for possible masses in the right
breast. Patient has a history of previous bilateral reduction
mammoplasty.

EXAM:
DIGITAL DIAGNOSTIC RIGHT MAMMOGRAM WITH CAD AND TOMO
ULTRASOUND RIGHT BREAST

[Series 1: ultrasound right breast limited · 0.07mm/px · 13 of 14 slices shown]
[im 1/14]
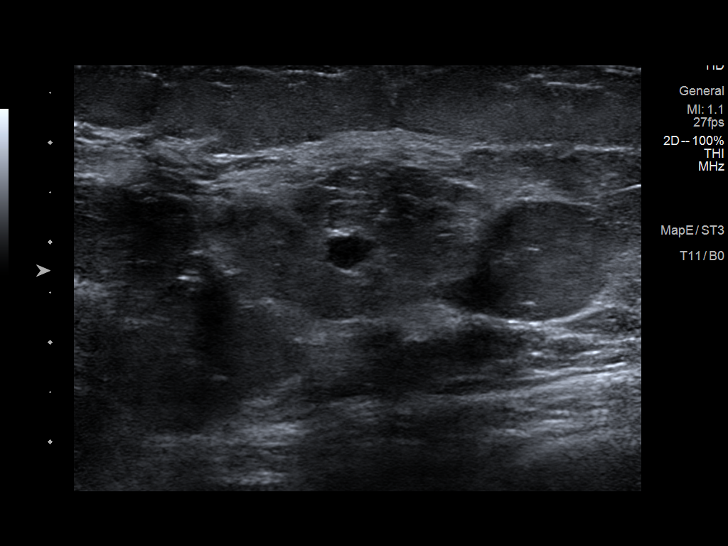
[im 2/14]
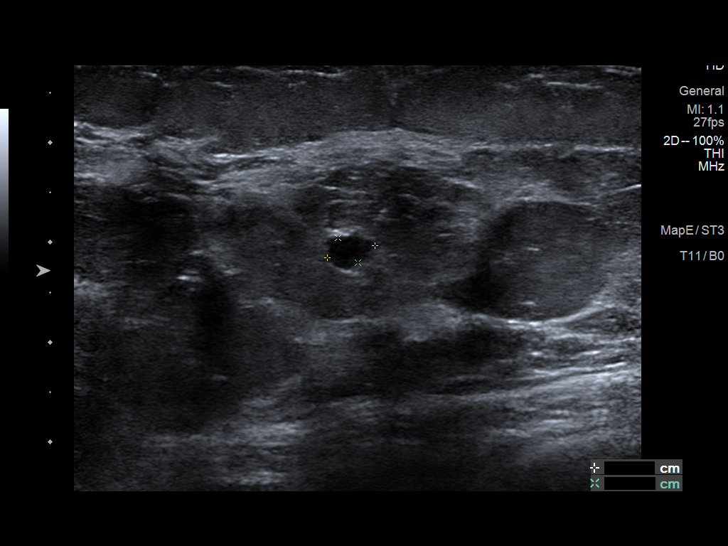
[im 3/14]
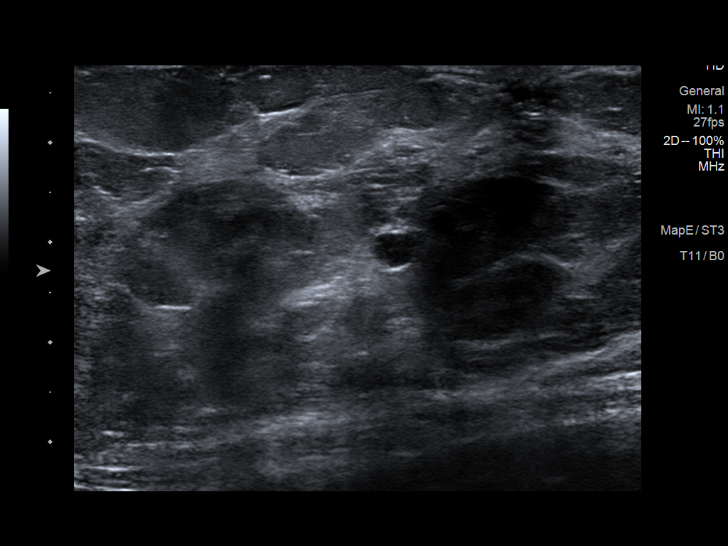
[im 4/14]
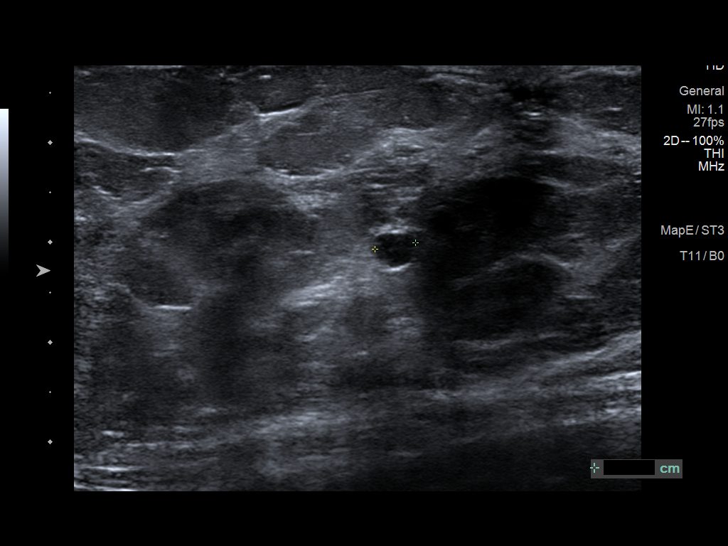
[im 5/14]
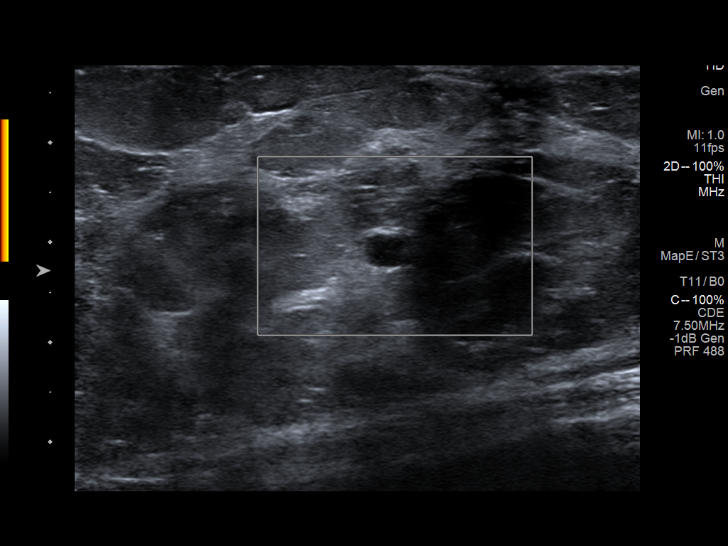
[im 6/14]
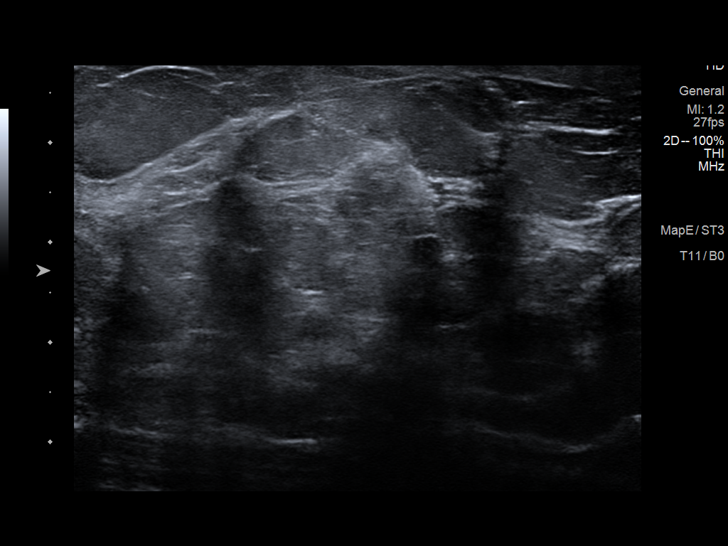
[im 8/14]
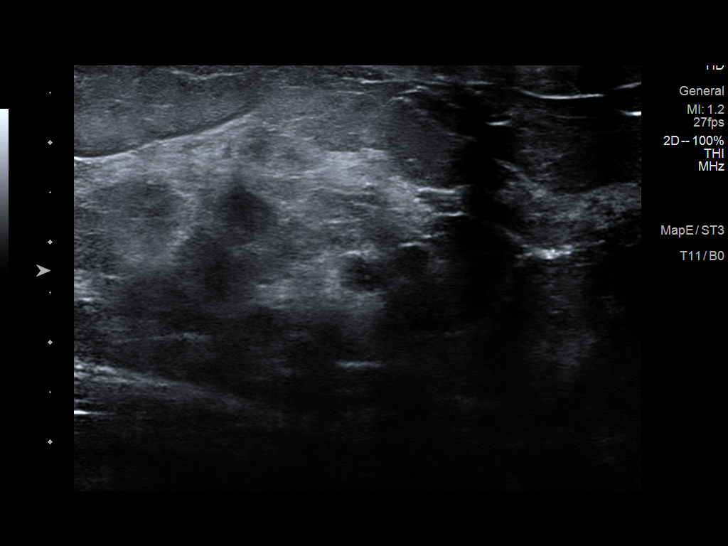
[im 9/14]
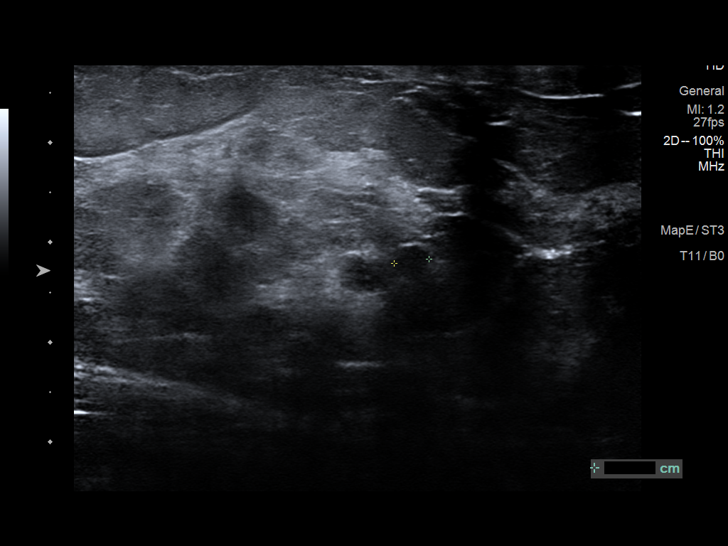
[im 10/14]
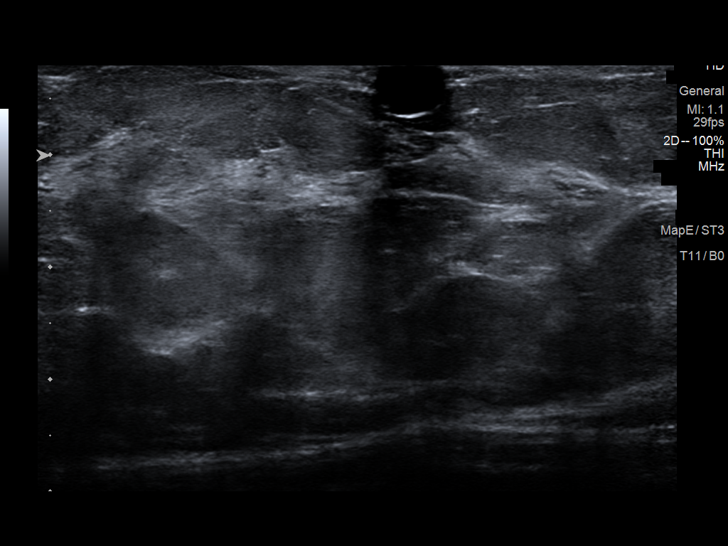
[im 11/14]
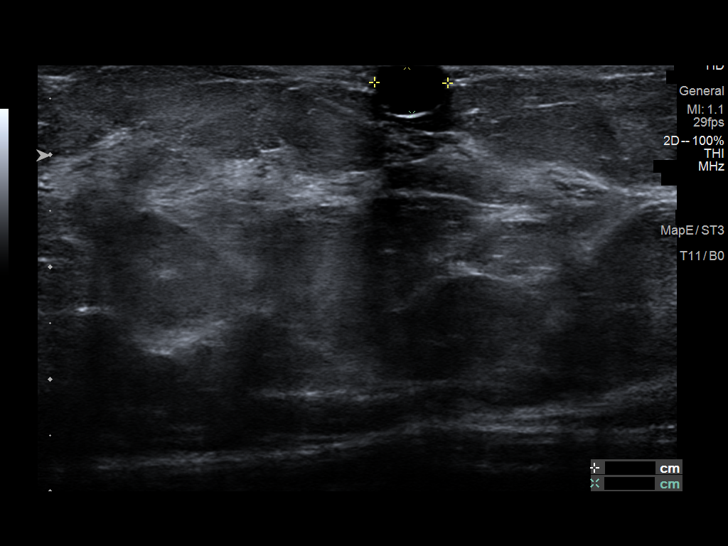
[im 12/14]
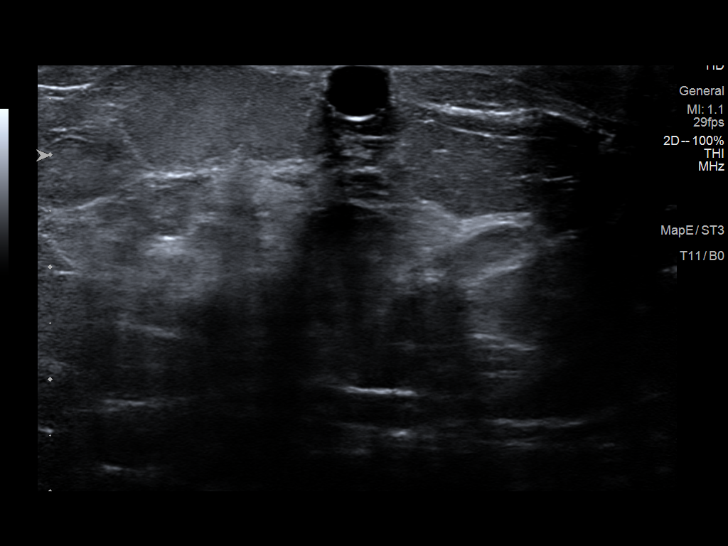
[im 13/14]
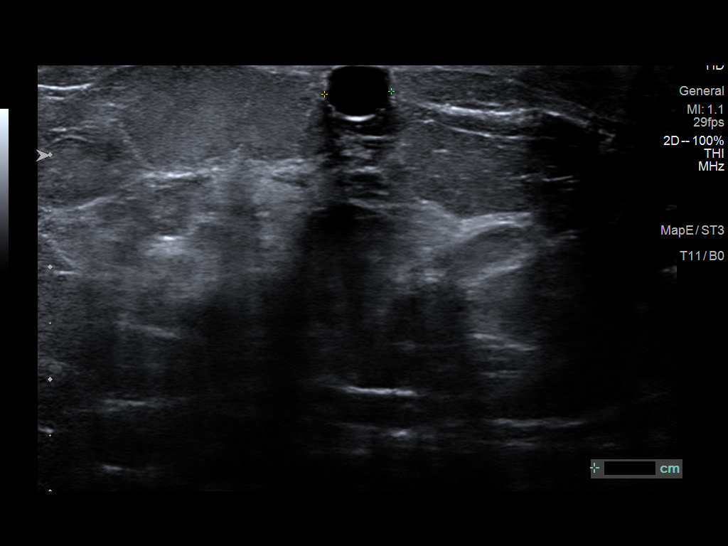
[im 14/14]
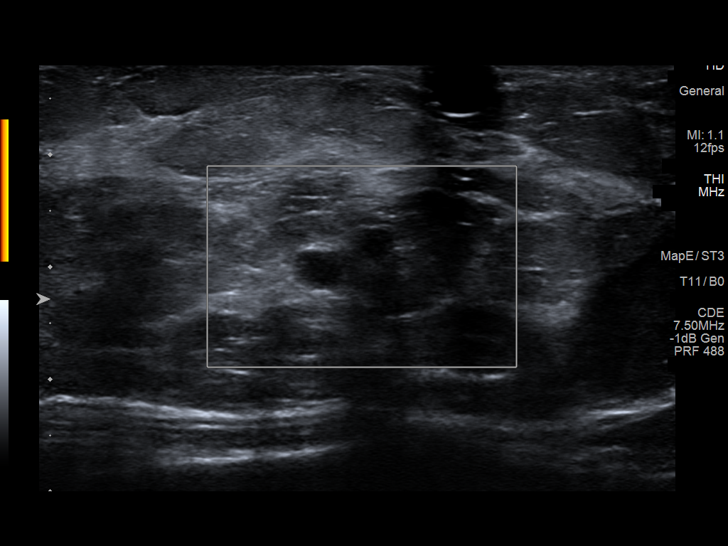

[13 of 14 positions shown; findings below may reference images not displayed]

ACR Breast Density Category b: There are scattered areas of
fibroglandular density.
FINDINGS: On the spot compression images, possible masses persist. These round
circumscribed masses, 3 in close approximation, in the retroareolar
right breast. The largest measures 5 mm in diameter.

Mammographic images were processed with CAD.

Targeted ultrasound is performed, showing several cysts in the
retroareolar right breast, slightly towards the 11 o'clock position,
the largest measuring 5 mm, which account for the mammographic
masses. There is additional superficial cyst abutting the dermis at
12 o'clock, retroareolar, measuring 7 mm in greatest dimension. No
solid or suspicious masses or lesions.
IMPRESSION: 1. No evidence of breast malignancy.
2. Benign right breast cysts, likely oil cysts from fat necrosis
given the periareolar incision from the previous reduction
mammoplasty.

RECOMMENDATION:
Screening mammogram in one year.(Code:YC-B-IDG)

I have discussed the findings and recommendations with the patient.
Results were also provided in writing at the conclusion of the
visit. If applicable, a reminder letter will be sent to the patient
regarding the next appointment.

BI-RADS CATEGORY  2: Benign.

## 2021-08-20 ENCOUNTER — Ambulatory Visit (INDEPENDENT_AMBULATORY_CARE_PROVIDER_SITE_OTHER): Payer: Managed Care, Other (non HMO) | Admitting: Podiatry

## 2021-08-20 DIAGNOSIS — B351 Tinea unguium: Secondary | ICD-10-CM | POA: Diagnosis not present

## 2021-08-20 MED ORDER — TERBINAFINE HCL 250 MG PO TABS: ORAL_TABLET | ORAL | 0 refills | Status: DC

## 2021-08-20 NOTE — Progress Notes (Signed)
Subjective:   Patient ID: Alison Sampson, female   DOB: 51 y.o.   MRN: [id number]   HPI Patient presents with discolored hallux nail left stating that she traumatized it last year and its been giving her trouble.  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  I have recommended a pulse Lamisil treatment of 1 pill a day 7 days for 4 months once a month followed by laser treatment of approximate 3 treatments with her scheduled for this to be done.  I did explain there is no guarantee that we can solve this but hopefully between the 2 that will make a big difference for her

## 2021-08-28 ENCOUNTER — Ambulatory Visit (INDEPENDENT_AMBULATORY_CARE_PROVIDER_SITE_OTHER): Payer: Self-pay | Admitting: *Deleted

## 2021-08-28 DIAGNOSIS — B351 Tinea unguium: Secondary | ICD-10-CM

## 2021-08-28 NOTE — Patient Instructions (Signed)

## 2021-08-28 NOTE — Progress Notes (Signed)
Patient presents today for the 1st laser treatment. Diagnosed with mycotic nail infection by Dr. Paulla Dolly.   Toenail most affected hallux nail left.  All other systems are negative.  Nails were filed thin. Laser therapy was administered to 1st toenails left and patient tolerated the treatment well. All safety precautions were in place.   She is on a pulse dose of lamisil x 4 months.   Follow up in 6 weeks for laser # 2.  Picture of nails taken today to document visual progress   ~Patient is on a combination of treatments, so we will do 6 week intervals and she may only need 3-4 treatments total.

## 2021-10-23 ENCOUNTER — Other Ambulatory Visit: Payer: Managed Care, Other (non HMO)

## 2021-11-17 ENCOUNTER — Other Ambulatory Visit: Payer: Self-pay | Admitting: Obstetrics and Gynecology

## 2021-12-04 ENCOUNTER — Other Ambulatory Visit: Payer: Self-pay | Admitting: Obstetrics and Gynecology

## 2021-12-30 ENCOUNTER — Encounter: Payer: Self-pay | Admitting: Cardiovascular Disease

## 2021-12-30 ENCOUNTER — Ambulatory Visit: Payer: Managed Care, Other (non HMO) | Attending: Cardiovascular Disease | Admitting: Cardiovascular Disease

## 2021-12-30 VITALS — BP 104/62 | HR 80 | Ht 64.0 in | Wt 145.8 lb

## 2021-12-30 DIAGNOSIS — R002 Palpitations: Secondary | ICD-10-CM

## 2021-12-30 DIAGNOSIS — Z01818 Encounter for other preprocedural examination: Secondary | ICD-10-CM | POA: Diagnosis not present

## 2021-12-30 DIAGNOSIS — G43109 Migraine with aura, not intractable, without status migrainosus: Secondary | ICD-10-CM

## 2021-12-30 DIAGNOSIS — E119 Type 2 diabetes mellitus without complications: Secondary | ICD-10-CM | POA: Diagnosis not present
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
## 2021-12-30 NOTE — Patient Instructions (Signed)
Medication Instructions:  Your physician recommends that you continue on your current medications as directed. Please refer to the Current Medication list given to you today.  *If you need a refill on your cardiac medications before your next appointment, please call your pharmacy*   Lab Work: Your physician recommends that you return ASAP to have the following labs drawn: CMET, Lipids, LP(a), Hemoglobin A1c, TSH and CBC  If you have labs (blood work) drawn today and your tests are completely normal, you will receive your results only by: [place] (if you have MyChart) OR A paper copy in the mail If you have any lab test that is abnormal or we need to change your treatment, we will call you to review the results.   Testing/Procedures: Your physician has requested that you have an echocardiogram. Echocardiography is a painless test that uses sound waves to create images of your heart. It provides your doctor with information about the size and shape of your heart and how well your heart's chambers and valves are working. This procedure takes approximately one hour. There are no restrictions for this procedure. Please do NOT wear cologne, perfume, aftershave, or lotions (deodorant is allowed). Please arrive 15 minutes prior to your appointment time.  Dr.Kelly has ordered a CT coronary calcium score.   Test locations:  [place]   This is $99 out of pocket.   Coronary CalciumScan A coronary calcium scan is an imaging test used to look for deposits of calcium and other fatty materials (plaques) in the inner lining of the blood vessels of the heart (coronary arteries). These deposits of calcium and plaques can partly clog and narrow the coronary arteries without producing any symptoms or warning signs. This puts a person at risk for a heart attack. This test can detect these deposits before symptoms develop. Tell a health care provider about: Any  allergies you have. All medicines you are taking, including vitamins, herbs, eye drops, creams, and over-the-counter medicines. Any problems you or family members have had with anesthetic medicines. Any blood disorders you have. Any surgeries you have had. Any medical conditions you have. Whether you are pregnant or may be pregnant. What are the risks? Generally, this is a safe procedure. However, problems may occur, including: Harm to a pregnant woman and her unborn baby. This test involves the use of radiation. Radiation exposure can be dangerous to a pregnant woman and her unborn baby. If you are pregnant, you generally should not have this procedure done. Slight increase in the risk of cancer. This is because of the radiation involved in the test. What happens before the procedure? No preparation is needed for this procedure. What happens during the procedure? You will undress and remove any jewelry around your neck or chest. You will put on a hospital gown. Sticky electrodes will be placed on your chest. The electrodes will be connected to an electrocardiogram (ECG) machine to record a tracing of the electrical activity of your heart. A CT scanner will take pictures of your heart. During this time, you will be asked to lie still and hold your breath for 2-3 seconds while a picture of your heart is being taken. The procedure may vary among health care providers and hospitals. What happens after the procedure? You can get dressed. You can return to your normal activities. It is up to you to get the results of your test. Ask your health care provider, or the department that is doing the test,  when your results will be ready. Summary A coronary calcium scan is an imaging test used to look for deposits of calcium and other fatty materials (plaques) in the inner lining of the blood vessels of the heart (coronary arteries). Generally, this is a safe procedure. Tell your health care provider if  you are pregnant or may be pregnant. No preparation is needed for this procedure. A CT scanner will take pictures of your heart. You can return to your normal activities after the scan is done. This information is not intended to replace advice given to you by your health care provider. Make sure you discuss any questions you have with your health care provider. Document Released: 07/10/2007 Document Revised: 12/01/2015 Document Reviewed: 12/01/2015 Elsevier Interactive Patient Education  2017 [place]: At [facility], you and your health needs are our priority.  As part of our continuing mission to provide you with exceptional heart care, we have created designated Provider Care Teams.  These Care Teams include your primary Cardiologist (physician) and Advanced Practice Providers (APPs -  Physician Assistants and Nurse Practitioners) who all work together to provide you with the care you need, when you need it.  We recommend signing up for the patient portal called "MyChart".  Sign up information is provided on this After Visit Summary.  MyChart is used to connect with patients for Virtual Visits (Telemedicine).  Patients are able to view lab/test results, encounter notes, upcoming appointments, etc.  Non-urgent messages can be sent to your provider as well.   To learn more about what you can do with MyChart, go to [email or web address].    Your next appointment:   3 month(s)  The format for your next appointment:   In Person  Provider:   Shelva Majestic, MD

## 2021-12-31 ENCOUNTER — Other Ambulatory Visit: Payer: Self-pay | Admitting: *Deleted

## 2021-12-31 DIAGNOSIS — Z01818 Encounter for other preprocedural examination: Secondary | ICD-10-CM

## 2022-01-01 LAB — COMPREHENSIVE METABOLIC PANEL
ALT: 11 IU/L (ref 0–32)
AST: 16 IU/L (ref 0–40)
Albumin/Globulin Ratio: 1.9 (ref 1.2–2.2)
Albumin: 4.3 g/dL (ref 3.9–4.9)
Alkaline Phosphatase: 48 IU/L (ref 44–121)
BUN/Creatinine Ratio: 16 (ref 9–23)
BUN: 10 mg/dL (ref 6–24)
Bilirubin Total: 0.4 mg/dL (ref 0.0–1.2)
CO2: 26 mmol/L (ref 20–29)
Calcium: 9.4 mg/dL (ref 8.7–10.2)
Chloride: 100 mmol/L (ref 96–106)
Creatinine, Ser: 0.64 mg/dL (ref 0.57–1.00)
Globulin, Total: 2.3 g/dL (ref 1.5–4.5)
Glucose: 80 mg/dL (ref 70–99)
Potassium: 4 mmol/L (ref 3.5–5.2)
Sodium: 138 mmol/L (ref 134–144)
Total Protein: 6.6 g/dL (ref 6.0–8.5)
eGFR: 108 mL/min/{1.73_m2} (ref >59)

## 2022-01-01 LAB — LIPID PANEL
Chol/HDL Ratio: 1.7 ratio (ref 0.0–4.4)
Cholesterol, Total: 111 mg/dL (ref 100–199)
HDL: 67 mg/dL (ref >39)
LDL Chol Calc (NIH): 34 mg/dL (ref 0–99)
Triglycerides: 33 mg/dL (ref 0–149)
VLDL Cholesterol Cal: 10 mg/dL (ref 5–40)

## 2022-01-01 LAB — CBC
Hematocrit: 36.7 % (ref 34.0–46.6)
Hemoglobin: 11.8 g/dL (ref 11.1–15.9)
MCH: 26.3 pg — ABNORMAL LOW (ref 26.6–33.0)
MCHC: 32.2 g/dL (ref 31.5–35.7)
MCV: 82 fL (ref 79–97)
Platelets: 346 10*3/uL (ref 150–450)
RBC: 4.48 x10E6/uL (ref 3.77–5.28)
RDW: 14.5 % (ref 11.7–15.4)
WBC: 5.9 10*3/uL (ref 3.4–10.8)

## 2022-01-01 LAB — HEMOGLOBIN A1C
Est. average glucose Bld gHb Est-mCnc: 111 mg/dL
Hgb A1c MFr Bld: 5.5 % (ref 4.8–5.6)

## 2022-01-01 LAB — TSH: TSH: 4.03 u[IU]/mL (ref 0.450–4.500)

## 2022-01-01 LAB — LIPOPROTEIN A (LPA): Lipoprotein (a): 66.6 nmol/L (ref <75.0)

## 2022-01-10 ENCOUNTER — Encounter: Payer: Self-pay | Admitting: Cardiovascular Disease

## 2022-01-11 ENCOUNTER — Other Ambulatory Visit: Payer: Self-pay | Admitting: Obstetrics and Gynecology

## 2022-01-12 ENCOUNTER — Encounter (HOSPITAL_BASED_OUTPATIENT_CLINIC_OR_DEPARTMENT_OTHER): Payer: Self-pay | Admitting: Obstetrics and Gynecology

## 2022-01-14 ENCOUNTER — Ambulatory Visit (HOSPITAL_BASED_OUTPATIENT_CLINIC_OR_DEPARTMENT_OTHER): Admit: 2022-01-14 | Payer: Managed Care, Other (non HMO) | Admitting: Obstetrics and Gynecology

## 2022-01-14 ENCOUNTER — Encounter (HOSPITAL_BASED_OUTPATIENT_CLINIC_OR_DEPARTMENT_OTHER): Payer: Self-pay

## 2022-01-14 SURGERY — ABLATION, ENDOMETRIUM, HYSTEROSCOPIC
Anesthesia: General

## 2022-01-20 ENCOUNTER — Encounter (HOSPITAL_COMMUNITY): Payer: Managed Care, Other (non HMO)

## 2022-01-22 ENCOUNTER — Ambulatory Visit (HOSPITAL_BASED_OUTPATIENT_CLINIC_OR_DEPARTMENT_OTHER)
Admission: RE | Admit: 2022-01-22 | Payer: Managed Care, Other (non HMO) | Source: Home / Self Care | Admitting: Obstetrics and Gynecology

## 2022-01-22 DIAGNOSIS — Z01818 Encounter for other preprocedural examination: Secondary | ICD-10-CM

## 2022-01-22 SURGERY — HYSTERECTOMY, VAGINAL, ROBOT-ASSISTED
Anesthesia: General

## 2022-01-26 ENCOUNTER — Ambulatory Visit (HOSPITAL_COMMUNITY): Payer: Managed Care, Other (non HMO) | Attending: Cardiology

## 2022-01-26 DIAGNOSIS — Z01818 Encounter for other preprocedural examination: Secondary | ICD-10-CM | POA: Diagnosis present

## 2022-01-26 DIAGNOSIS — R002 Palpitations: Secondary | ICD-10-CM | POA: Diagnosis not present

## 2022-01-26 DIAGNOSIS — Z01810 Encounter for preprocedural cardiovascular examination: Secondary | ICD-10-CM | POA: Diagnosis not present

## 2022-01-26 DIAGNOSIS — I361 Nonrheumatic tricuspid (valve) insufficiency: Secondary | ICD-10-CM

## 2022-01-26 DIAGNOSIS — E119 Type 2 diabetes mellitus without complications: Secondary | ICD-10-CM | POA: Insufficient documentation

## 2022-01-26 DIAGNOSIS — I517 Cardiomegaly: Secondary | ICD-10-CM

## 2022-01-26 LAB — ECHOCARDIOGRAM COMPLETE
Area-P 1/2: 3.95 cm2
S' Lateral: 2.7 cm

## 2022-02-12 ENCOUNTER — Ambulatory Visit (HOSPITAL_BASED_OUTPATIENT_CLINIC_OR_DEPARTMENT_OTHER)
Admission: RE | Admit: 2022-02-12 | Discharge: 2022-02-12 | Disposition: A | Discharge status: Home / Self Care | Payer: Managed Care, Other (non HMO) | Source: Ambulatory Visit | Attending: Cardiovascular Disease | Admitting: Cardiovascular Disease

## 2022-02-12 DIAGNOSIS — Z01818 Encounter for other preprocedural examination: Secondary | ICD-10-CM

## 2022-04-05 ENCOUNTER — Ambulatory Visit: Payer: Managed Care, Other (non HMO) | Attending: Cardiovascular Disease | Admitting: Cardiovascular Disease

## 2022-04-05 ENCOUNTER — Encounter: Payer: Self-pay | Admitting: Cardiovascular Disease

## 2022-04-05 VITALS — BP 112/74 | HR 73 | Ht 64.0 in | Wt 161.0 lb

## 2022-04-05 DIAGNOSIS — E119 Type 2 diabetes mellitus without complications: Secondary | ICD-10-CM

## 2022-04-05 DIAGNOSIS — Z01818 Encounter for other preprocedural examination: Secondary | ICD-10-CM | POA: Diagnosis not present

## 2022-04-05 DIAGNOSIS — G43109 Migraine with aura, not intractable, without status migrainosus: Secondary | ICD-10-CM

## 2022-04-05 DIAGNOSIS — R002 Palpitations: Secondary | ICD-10-CM

## 2022-04-05 NOTE — Patient Instructions (Signed)
Medication Instructions:   No changes  *If you need a refill on your cardiac medications before your next appointment, please call your pharmacy*   Lab Work: Not needed    Testing/Procedures:  Not needed  Follow-Up: At [facility], you and your health needs are our priority.  As part of our continuing mission to provide you with exceptional heart care, we have created designated Provider Care Teams.  These Care Teams include your primary Cardiologist (physician) and Advanced Practice Providers (APPs -  Physician Assistants and Nurse Practitioners) who all work together to provide you with the care you need, when you need it.     Your next appointment:   As needed     The format for your next appointment:   In Person  Provider:   Shelva Majestic, MD
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
## 2022-07-21 ENCOUNTER — Telehealth: Payer: Self-pay | Admitting: *Deleted

## 2022-07-21 NOTE — Telephone Encounter (Signed)
   Pre-operative Risk Assessment    Patient Name: Alison Sampson  DOB: Nov 22, 1970 MRN: [id number]      Request for Surgical Clearance    Procedure:   ROBOTIC ASSISTED LAPAROSCOPIC HYSTERECTOMY. B/L SALPINGECTOMY AND ANTERIOR/POSTERIOR COLPORRHAPHY  Date of Surgery:  Clearance 08/26/22                                 Surgeon:  DR. [facility] RIVARD Surgeon's Group or Practice Name:  [facility] Phone number:  [phone] Fax number:  [phone]   Type of Clearance Requested:   - Medical ; NO MEDICATIONS LISTED AS NEEDING TO BE HELD   Type of Anesthesia:  General    Additional requests/questions:    Elpidio Anis   07/21/2022, 6:02 PM

## 2022-07-22 ENCOUNTER — Telehealth: Payer: Self-pay | Admitting: *Deleted

## 2022-07-22 NOTE — Telephone Encounter (Signed)
I s/w the pt and she has been scheduled for a tele pre op appt 08/09/22 @ 9:40. Med rec and consent are done. I will update all parties involved.

## 2022-07-22 NOTE — Telephone Encounter (Signed)
I s/w the pt and she has been scheduled for a tele pre op appt 08/09/22 @ 9:40. Med rec and consent are done. I will update all parties involved.      Patient Consent for Virtual Visit        Alison Sampson has provided verbal consent on 07/22/2022 for a virtual visit (video or telephone).   CONSENT FOR VIRTUAL VISIT FOR:  Alison Sampson  By participating in this virtual visit I agree to the following:  I hereby voluntarily request, consent and authorize [facility] and its employed or contracted physicians, physician assistants, nurse practitioners or other licensed health care professionals (the Practitioner), to provide me with telemedicine health care services (the "Services") as deemed necessary by the treating Practitioner. I acknowledge and consent to receive the Services by the Practitioner via telemedicine. I understand that the telemedicine visit will involve communicating with the Practitioner through live audiovisual communication technology and the disclosure of certain medical information by electronic transmission. I acknowledge that I have been given the opportunity to request an in-person assessment or other available alternative prior to the telemedicine visit and am voluntarily participating in the telemedicine visit.  I understand that I have the right to withhold or withdraw my consent to the use of telemedicine in the course of my care at any time, without affecting my right to future care or treatment, and that the Practitioner or I may terminate the telemedicine visit at any time. I understand that I have the right to inspect all information obtained and/or recorded in the course of the telemedicine visit and may receive copies of available information for a reasonable fee.  I understand that some of the potential risks of receiving the Services via telemedicine include:  Delay or interruption in medical evaluation due to technological equipment failure or  disruption; Information transmitted may not be sufficient (e.g. poor resolution of images) to allow for appropriate medical decision making by the Practitioner; and/or  In rare instances, security protocols could fail, causing a breach of personal health information.  Furthermore, I acknowledge that it is my responsibility to provide information about my medical history, conditions and care that is complete and accurate to the best of my ability. I acknowledge that Practitioner's advice, recommendations, and/or decision may be based on factors not within their control, such as incomplete or inaccurate data provided by me or distortions of diagnostic images or specimens that may result from electronic transmissions. I understand that the practice of medicine is not an exact science and that Practitioner makes no warranties or guarantees regarding treatment outcomes. I acknowledge that a copy of this consent can be made available to me via my patient portal [facility] MyChart), or I can request a printed copy by calling the office of [facility].    I understand that my insurance will be billed for this visit.   I have read or had this consent read to me. I understand the contents of this consent, which adequately explains the benefits and risks of the Services being provided via telemedicine.  I have been provided ample opportunity to ask questions regarding this consent and the Services and have had my questions answered to my satisfaction. I give my informed consent for the services to be provided through the use of telemedicine in my medical care

## 2022-07-22 NOTE — Telephone Encounter (Signed)
   Name: Alison Sampson  DOB: 07/29/1970  MRN: [id number]  Primary Cardiologist: Nicki Guadalajara, MD   Preoperative team, please contact this patient and set up a phone call appointment for further preoperative risk assessment. Please obtain consent and complete medication review. Thank you for your help.  I confirm that guidance regarding antiplatelet and oral anticoagulation therapy has been completed and, if necessary, noted below.  None requested.   Napoleon Form, Leodis Rains, NP 07/22/2022, 8:01 AM [facility]

## 2022-08-03 ENCOUNTER — Other Ambulatory Visit: Payer: Self-pay

## 2022-08-03 ENCOUNTER — Encounter (HOSPITAL_BASED_OUTPATIENT_CLINIC_OR_DEPARTMENT_OTHER): Payer: Self-pay | Admitting: Obstetrics and Gynecology

## 2022-08-03 DIAGNOSIS — N816 Rectocele: Secondary | ICD-10-CM | POA: Diagnosis not present

## 2022-08-03 DIAGNOSIS — N939 Abnormal uterine and vaginal bleeding, unspecified: Secondary | ICD-10-CM | POA: Diagnosis not present

## 2022-08-03 DIAGNOSIS — Z01812 Encounter for preprocedural laboratory examination: Secondary | ICD-10-CM | POA: Diagnosis not present

## 2022-08-03 DIAGNOSIS — N8110 Cystocele, unspecified: Secondary | ICD-10-CM | POA: Diagnosis not present

## 2022-08-03 NOTE — Progress Notes (Addendum)
Spoke w/ via phone for pre-op interview---Alison Sampson needs dos---- urine pregnancy per anesthesia, surgeon orders pending               Sampson results------08/20/22 Sampson appt for cbc, type & screen, bmp, 12/30/21 EKG in Epic & chart COVID test -----patient states asymptomatic no test needed Arrive at -------0945 on Thursday, 08/26/22 NPO after MN NO Solid Food.  Clear liquids from MN until---0845 Med rec completed Medications to take morning of surgery -----Maxalt prn Diabetic medication -----Patient has been holding Mounjaro since 12/2021. She states that an anesthesiologist from [facility] told her she should hold it for 6 months. I let her know that we only ask patient to hold for 7 days. Patient instructed no nail polish to be worn day of surgery Patient instructed to bring photo id and insurance card day of surgery Patient aware to have Driver (ride ) / caregiver    for 24 hours after surgery - husband or daughter Patient Special Instructions -----Extended / overnight stay instructions given. Pre-Op special Instructions -----Requested orders from Dr. Estanislado Pandy via Epic IB on 08/03/22. Patient verbalized understanding of instructions that were given at this phone interview. Patient denies shortness of breath, chest pain, fever, cough at this phone interview.  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 She had no prior heart hx.  Cardiologist: Nicki Guadalajara, MD, LOV 04/05/22 in chart & Epic. Patient is scheduled for a televisit for cardiac clearance on 08/09/22. 02/12/22 CT cardiac scoring in Epic, cardiac score 0 01/26/2022 Echo in Epic, EF = 60 -65% Patient denies chest pain, SOB or heart palpitations.  Patient has a hx of seizures after a concussion in 2013. Per patient, seizures stopped in 2019. She states no seizures since 2019 and no longer takes any seizure medications.  Addendum: Cardiac clearance dated 08/09/22 placed in chart and  also in Epic.

## 2022-08-03 NOTE — Progress Notes (Addendum)
Your procedure is scheduled on Thursday, 08/26/22.  Report to [facility] AT  9:45 AM.   Call this number if you have problems the morning of surgery  :[phone].   OUR ADDRESS IS [address].  WE ARE LOCATED IN [facility].  PLEASE BRING YOUR INSURANCE CARD AND PHOTO ID DAY OF SURGERY.  ONLY 2 PEOPLE ARE ALLOWED IN  WAITING  ROOM                                      REMEMBER:  DO NOT EAT FOOD, CANDY GUM OR MINTS  AFTER MIDNIGHT THE NIGHT BEFORE YOUR SURGERY . YOU MAY HAVE CLEAR LIQUIDS FROM MIDNIGHT THE NIGHT BEFORE YOUR SURGERY UNTIL 8:45 AM. NO CLEAR LIQUIDS AFTER   8:45 AM DAY OF SURGERY.  YOU MAY  BRUSH YOUR TEETH MORNING OF SURGERY AND RINSE YOUR MOUTH OUT, NO CHEWING GUM CANDY OR MINTS.     CLEAR LIQUID DIET    Allowed      Water                                                                   Coffee and tea, regular and decaf  (NO cream or milk products of any type, may sweeten)                         Carbonated beverages, regular and diet                                    Sports drinks like Gatorade _____________________________________________________________________     TAKE ONLY THESE MEDICATIONS MORNING OF SURGERY: Maxalt if needed                                        DO NOT WEAR JEWERLY/  METAL/  PIERCINGS (INCLUDING NO PLASTIC PIERCINGS) DO NOT WEAR LOTIONS, POWDERS, PERFUMES OR NAIL POLISH ON YOUR FINGERNAILS. TOENAIL POLISH IS OK TO WEAR. DO NOT SHAVE FOR 48 HOURS PRIOR TO DAY OF SURGERY.  CONTACTS, GLASSES, OR DENTURES MAY NOT BE WORN TO SURGERY.  REMEMBER: NO SMOKING, VAPING ,  DRUGS OR ALCOHOL FOR 24 HOURS BEFORE YOUR SURGERY.                                    [facility] IS NOT RESPONSIBLE  FOR ANY BELONGINGS.                                                                    Marland Kitchen           [facility] - Preparing for Surgery Before  surgery, you can play an important role.  Because skin is not  sterile, your skin needs to be as free of germs as possible.  You can reduce the number of germs on your skin by washing with CHG (chlorahexidine gluconate) soap before surgery.  CHG is an antiseptic cleaner which kills germs and bonds with the skin to continue killing germs even after washing. Please DO NOT use if you have an allergy to CHG or antibacterial soaps.  If your skin becomes reddened/irritated stop using the CHG and inform your nurse when you arrive at Short Stay. Do not shave (including legs and underarms) for at least 48 hours prior to the first CHG shower.  You may shave your face/neck. Please follow these instructions carefully:  1.  Shower with CHG Soap the night before surgery and the  morning of Surgery.  2.  If you choose to wash your hair, wash your hair first as usual with your  normal  shampoo.  3.  After you shampoo, rinse your hair and body thoroughly to remove the  shampoo.                                        4.  Use CHG as you would any other liquid soap.  You can apply chg directly  to the skin and wash , chg soap provided, night before and morning of your surgery.  5.  Apply the CHG Soap to your body ONLY FROM THE NECK DOWN.   Do not use on face/ open                           Wound or open sores. Avoid contact with eyes, ears mouth and genitals (private parts).                       Wash face,  Genitals (private parts) with your normal soap.             6.  Wash thoroughly, paying special attention to the area where your surgery  will be performed.  7.  Thoroughly rinse your body with warm water from the neck down.  8.  DO NOT shower/wash with your normal soap after using and rinsing off  the CHG Soap.             9.  Pat yourself dry with a clean towel.            10.  Wear clean pajamas.            11.  Place clean sheets on your bed the night of your first shower and do not  sleep with pets. Day of Surgery : Do not apply any lotions/ powders the morning of  surgery.  Please wear clean clothes to the hospital/surgery center.  IF YOU HAVE ANY SKIN IRRITATION OR PROBLEMS WITH THE SURGICAL SOAP, PLEASE GET A BAR OF GOLD DIAL SOAP AND SHOWER THE NIGHT BEFORE YOUR SURGERY AND THE MORNING OF YOUR SURGERY. PLEASE LET THE NURSE KNOW MORNING OF YOUR SURGERY IF YOU HAD ANY PROBLEMS WITH THE SURGICAL SOAP.   YOUR SURGEON MAY HAVE REQUESTED EXTENDED RECOVERY TIME AFTER YOUR SURGERY. IT COULD BE A  JUST A FEW HOURS  UP TO AN OVERNIGHT STAY.  YOUR SURGEON SHOULD HAVE DISCUSSED THIS WITH YOU PRIOR TO YOUR  SURGERY. IN THE EVENT YOU NEED TO STAY OVERNIGHT PLEASE REFER TO THE FOLLOWING GUIDELINES. YOU MAY HAVE UP TO 4 VISITORS  MAY VISIT IN THE EXTENDED RECOVERY ROOM UNTIL 800 PM ONLY.  ONE  VISITOR AGE 33 AND OVER MAY SPEND THE NIGHT AND MUST BE IN EXTENDED RECOVERY ROOM NO LATER THAN 800 PM . YOUR DISCHARGE TIME AFTER YOU SPEND THE NIGHT IS 900 AM THE MORNING AFTER YOUR SURGERY. YOU MAY PACK A SMALL OVERNIGHT BAG WITH TOILETRIES FOR YOUR OVERNIGHT STAY IF YOU WISH.  REGARDLESS OF IF YOU STAY OVER NIGHT OR ARE DISCHARGED THE SAME DAY YOU WILL BE REQUIRED TO HAVE A RESPONSIBLE ADULT (18 YRS OLD OR OLDER) STAY WITH YOU FOR AT LEAST THE FIRST 24 HOURS  YOUR PRESCRIPTION MEDICATIONS WILL BE PROVIDED DURING YOUR HOSPITAL STAY.  ________________________________________________________________________                                                        QUESTIONS Mechele Claude PRE OP NURSE PHONE [phone].

## 2022-08-05 NOTE — H&P (Cosign Needed Addendum)
Alison Sampson is a 52 y.o. female 52 YO female, P: 3-0-1-3 presents for hysterectomy and anterior-posterior colporraphy because of abnormal uterine bleeding and symptomatic pelvic prolapse. For over a year the patient reports a menstrual flow that may last 14-45 days requiring a pad change every 30 minutes on most days. Additionally she reports cramping rated 6/10 that she tolerates. A "normal" menstrual flow for the patient lasts 7 days with an overnight size pad change every 20 minutes due to palm sized blood clots. A pelvic ultrasound July 05, 2022 revealed a retroverted uterus: volume 191 cc, endometrium: 4.89 mm and 3 measurable fibroids: #1 posterior fundal intramural displacing endometrium anteriorly-4.44 cm #2 right lateral pedunculated lower uterine segment-2.93 cm and #3 anterior lower uterine segment subserosal-2.04 cm; both ovaries were normal. She denies urinary urgency, frequency or incontinence but admits to nocturia every 1.5 hours and incomplete bladder emptying. She goes on to report problems evacuation her rectum and will consequently bleed rectally due to a flare up of her hemorrhoids. She denies vaginitis symptoms or pelvic pain unrelated to menses but does have deep dyspareunia that at times persists after intercourse. The patient was placed on norethindrone acetate 10 mg which reduced her menstrual flow to 5 days and the need to change a pad to every 2-3 hours. Her cramping also improved. The patient's history is significant for a 9 pound 14 oz. vaginal delivery, cardiac arrest during breast surgery (has received cardiac clearance for this surgery), caesarean delivery (11+ lbs infant), abdominoplasty and a history of Mounjaro (discontinued December 2023 in anticipation of surgery). A review of both medical and surgical options for managing her abnormal bleeding and pelvic prolapse was given to the patient. She has decided to proceed with definitive therapy in the form of hysterectomy with  anterior-posterior colporrhaphy.  Past Medical History  OB History: G:4;  P: 3-0-1-3; 1990 C- section; 2001 and 2003-SVB  (largest vaginal delivery weighed 9 lbs.14 oz. GYN History: menarche: 52 YO;     LMP: 10/03/2022;  Contraception bilateral tubal ligation ;   Denies history of abnormal PAP smear.  Last PAP smear: 10/14/2021-normal  Medical History: Diabetes Mellitus, (12/2021 HgbA1C= 5.5%), Vertebral Fracture C7-S1, GERD, Migraine, Hemorrhoids, Eczema, Asthma, Seizure, Vitamin D Deficiency, Arthritis and Cardiac Arrest during breast surgery (has cardiac clearance)  Surgical History: 1990: Breast Implants and C-section; 2000: Left Laparoscopic Salpingectomy for Ectopic; 2003: Right Tubal Sterilization; 2006: Right ACL Repair; 2008: Umbilical Hernia Repair (no mesh);  2009-2012 Multiple Lumbar Disc Surgeries; 2013 Umbilical Hernia Repair (no mesh); 2014: Breast Implants Removed and Breast Reduction; 2014 Abdominoplasty Denies problems with anesthesia except she awakened during knee surgery.  Has a history of post partum blood transfusion in 1990-4 units and has post operative nausea and vomiting.  Family History: Cancer: breast, cervical, ovarian, lung and uterine, Hypertension, Heart Disease, Diabetes, Substance Abuse and Mental Illness  Social History:   Married and retired from the [employer];  Denies tobacco use and rarely consumes alcohol   Medications: Rizatriptan 5 mg prn Epi Pen (due to food and environmental allergies) Naproxen 500 mg po daily prn Allergies  Allergen Reactions   Emgality [Galcanezumab-Gnlm] Shortness Of Breath and Swelling    Tongue swelling   Tetracyclines & Related Nausea And Vomiting  Patient has some food and environmental allergies therefore carries an Epi Pen  ROS: Admits to glasses for distance, tinnitus, occasional eczema flare, occasional headaches (migraine) but  denies vision changes, nasal congestion, dysphagia, dizziness, hoarseness, cough,   chest pain,  shortness of breath, nausea, vomiting, diarrhea,  urinary frequency, urgency  dysuria, hematuria, vaginitis symptoms, pelvic pain, swelling of joints,easy bruising,  myalgias, unexplained weight loss and except as is mentioned in the history of present illness, patient's review of systems is otherwise negative.    Physical Exam  Bp: 124/84; Weight: 176.2 lbs.  Height: 5\' 4" ; BMI: 30.2  Neck: supple without masses or thyromegaly Lungs: clear to auscultation Heart: regular rate and rhythm Abdomen: soft, non-tender and no organomegaly Pelvic:EGBUS- wnl; vagina with blood and 3/4 cystocele and 2/4 rectocele; uterus-normal size, cervix without lesions or motion tenderness; adnexae-no tenderness or masses Extremities:  no clubbing, cyanosis or edema   Assesment: Abnormal Uterine Bleeding                      Uterine Fibroids                      Cystocele   Disposition:  The robot-assisted hysterectomy and anterior-posterior colporrhaphy was reviewed with the patient along with the indication for her procedure. Benefits of the robotic approach include lesser postoperative pain, less blood loss during surgery, reduced risk of injury to other organs, due to better visualization with a 3-D HD 10 times magnifying camera; shorter hospital stay between 0-1 night and rapid recovery with return to daily routine in 2-3 weeks (however, nothing in the vagina for 6 weeks). Although the robot-assisted hysterectomy has a longer operative time than traditional laparotomy, the benefits usually outweigh the risks, in a patient with good medical history,   Risks include bleeding, infection, injury to other organs (bladder more vulnerable if previous cesarean delivery) , need for laparotomy, transient post-operative facial edema, urinary retention, increased risk of pelvic prolapse (associated with any hysterectomy) as well as earlier onset of menopause. Preservation of the ovaries was also reviewed and  recommended. Finally, we recommended bilateral salpingectomy for lifelong reduction of ovarian cancer. A Miralax Bowel Prep was given to the patient to complete the day before her surgery.  The patient's questions were answered and she verbalized understanding of the risks and pre-operative instructions. The patient has consented to proceed with a Robot Assisted Laparoscopic Hysterectomy, Bilateral Salpingectomy and Anterior-Posterior Colporrhaphy at [facility] on October 28, 2022  CSN# [id number]   Sharita Bienaime J. Lowell Guitar, PA-C  for Dr. Crist Fat. Rivard

## 2022-08-09 ENCOUNTER — Ambulatory Visit: Payer: Managed Care, Other (non HMO) | Attending: Cardiovascular Disease

## 2022-08-09 DIAGNOSIS — Z01810 Encounter for preprocedural cardiovascular examination: Secondary | ICD-10-CM | POA: Diagnosis not present

## 2022-08-09 NOTE — Progress Notes (Signed)
Virtual Visit via Telephone Note   Because of Alison Sampson's co-morbid illnesses, she is at least at moderate risk for complications without adequate follow up.  This format is felt to be most appropriate for this patient at this time.  The patient did not have access to video technology/had technical difficulties with video requiring transitioning to audio format only (telephone).  All issues noted in this document were discussed and addressed.  No physical exam could be performed with this format.  Please refer to the patient's chart for her consent to telehealth for [facility].  Evaluation Performed:  Preoperative cardiovascular risk assessment _____________   Date:  08/09/2022   Patient ID:  Alison Sampson, DOB 09-04-1970, MRN [id number] Patient Location:  Home Provider location:   Office  Primary Care Provider:  Maggie Font, FNP Primary Cardiologist:  Nicki Guadalajara, MD  Chief Complaint / Patient Profile   52 y.o. y/o female with a h/o palpitations, type 2 diabetes, migraines who is pending  ROBOTIC ASSISTED LAPAROSCOPIC HYSTERECTOMY. B/L SALPINGECTOMY AND ANTERIOR/POSTERIOR COLPORRHAPHY  and presents today for telephonic preoperative cardiovascular risk assessment.  History of Present Illness    Alison Sampson is a 52 y.o. female who presents via audio/video conferencing for a telehealth visit today.  Pt was last seen in cardiology clinic on 04/05/2022 by Dr. Tresa Endo.  At that time KIMBERLI WINNE was doing well .  The patient is now pending procedure as outlined above. Since her last visit, she continues to be stable from a cardiac standpoint.  Today she denies chest pain, shortness of breath, lower extremity edema, fatigue, palpitations, melena, hematuria, hemoptysis, diaphoresis, weakness, presyncope, syncope, orthopnea, and PND.   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   PONV (postoperative nausea and vomiting)    Seizures (HCC)    hx of seizures after concussion in 2013, no seizures since 2019, no longer on meds   Umbilical hernia    Vitamin D deficiency    Past Surgical History:  Procedure Laterality Date   ABDOMINOPLASTY  2014   ARTHROSCOPIC REPAIR ACL  2010, 2011   ACL Repair x 2   BREAST ENHANCEMENT SURGERY  1990   implants   CESAREAN SECTION  1990   LUMBAR DISC SURGERY  x5 surgeries per pt; no hardware   REDUCTION MAMMAPLASTY Bilateral 2014   SALPINGECTOMY Left 2000   TUBAL LIGATION  2003   UMBILICAL HERNIA REPAIR  03/24/2011   Procedure: HERNIA REPAIR UMBILICAL ADULT;  Surgeon: Clovis Pu. Cornett, MD;  Location: [facility];  Service: General;  Laterality: N/A;  umbilicus    Allergies  Allergies  Allergen Reactions   Emgality [Galcanezumab-Gnlm] Shortness Of Breath and Swelling    Tongue swelling   Tetracyclines & Related Nausea And Vomiting    Home Medications    Prior to Admission medications   Medication Sig Start  Date End Date Taking? Authorizing Provider  aspirin-acetaminophen-caffeine (EXCEDRIN MIGRAINE) [phone] MG tablet Take by mouth every 6 (six) hours as needed for headache.    [provider]  MOUNJARO 7.5 MG/0.5ML Pen Last taken 12/2021    [provider]  naproxen (NAPROSYN) 250 MG tablet Take by mouth as needed.    [provider]  rizatriptan (MAXALT-MLT) 5 MG disintegrating tablet as needed. 08/06/21   [provider]    Physical Exam    Vital Signs:  JAN WALTERS does not have vital signs available for review today.  Given telephonic nature of communication, physical exam is limited. AAOx3. NAD. Normal affect.  Speech and respirations are unlabored.  Accessory Clinical Findings    None  Assessment & Plan    1.  Preoperative Cardiovascular Risk Assessment: Robotic assisted laparoscopic hysterectomy, bilateral salpingo nephrectomy and anterior/posterior colporrhaphy 08/26/2022, Dr.Rivard, fax [phone]    Primary Cardiologist: Nicki Guadalajara, MD  Chart reviewed as part of pre-operative protocol coverage. Given past medical history and time since last visit, based on ACC/AHA guidelines, Rida ELONI DARIUS would be at acceptable risk for the planned procedure without further cardiovascular testing.   Her RCRI is a class II risk, 0.9% risk of major cardiac event.  She is able to complete greater than 4 METS of physical activity.  Patient was advised that if she develops new symptoms prior to surgery to contact our office to arrange a follow-up appointment.  She verbalized understanding.  I will route this recommendation to the requesting party via Epic fax function and remove from pre-op pool.       Time:   Today, I have spent 5 minutes with the patient with telehealth technology discussing medical history, symptoms, and management plan.  Prior to her phone evaluation I spent greater than 10 minutes reviewing her past medical history and cardiac  medications.   Ronney Asters, NP  08/09/2022, 7:54 AM

## 2022-08-12 NOTE — Progress Notes (Signed)
Cardiac clearance Alison cleaver np dated 08-09-2022 in epic and on chart for 08-26-2022 surgery

## 2022-08-20 ENCOUNTER — Encounter (HOSPITAL_COMMUNITY)
Admission: RE | Admit: 2022-08-20 | Discharge: 2022-08-20 | Disposition: A | Discharge status: Home / Self Care | Payer: Managed Care, Other (non HMO) | Source: Ambulatory Visit | Attending: Obstetrics and Gynecology

## 2022-08-20 DIAGNOSIS — N8110 Cystocele, unspecified: Secondary | ICD-10-CM | POA: Insufficient documentation

## 2022-08-20 DIAGNOSIS — Z01812 Encounter for preprocedural laboratory examination: Secondary | ICD-10-CM | POA: Insufficient documentation

## 2022-08-20 DIAGNOSIS — N939 Abnormal uterine and vaginal bleeding, unspecified: Secondary | ICD-10-CM | POA: Insufficient documentation

## 2022-08-20 DIAGNOSIS — N816 Rectocele: Secondary | ICD-10-CM

## 2022-08-20 DIAGNOSIS — Z01818 Encounter for other preprocedural examination: Secondary | ICD-10-CM

## 2022-08-20 LAB — BASIC METABOLIC PANEL
Anion gap: 9 (ref 5–15)
BUN: 8 mg/dL (ref 6–20)
CO2: 23 mmol/L (ref 22–32)
Calcium: 8.7 mg/dL — ABNORMAL LOW (ref 8.9–10.3)
Chloride: 105 mmol/L (ref 98–111)
Creatinine, Ser: 0.6 mg/dL (ref 0.44–1.00)
GFR, Estimated: >60 mL/min (ref >60)
Glucose, Bld: 98 mg/dL (ref 70–99)
Potassium: 3.7 mmol/L (ref 3.5–5.1)
Sodium: 137 mmol/L (ref 135–145)

## 2022-08-20 LAB — TYPE AND SCREEN
ABO/RH(D): B POS
Antibody Screen: NEGATIVE

## 2022-08-20 LAB — CBC
HCT: 35.8 % — ABNORMAL LOW (ref 36.0–46.0)
Hemoglobin: 11.7 g/dL — ABNORMAL LOW (ref 12.0–15.0)
MCH: 26.7 pg (ref 26.0–34.0)
MCHC: 32.7 g/dL (ref 30.0–36.0)
MCV: 81.7 fL (ref 80.0–100.0)
Platelets: 319 10*3/uL (ref 150–400)
RBC: 4.38 MIL/uL (ref 3.87–5.11)
RDW: 13.5 % (ref 11.5–15.5)
WBC: 5.5 10*3/uL (ref 4.0–10.5)
nRBC: 0 % (ref 0.0–0.2)

## 2022-08-26 DIAGNOSIS — N939 Abnormal uterine and vaginal bleeding, unspecified: Secondary | ICD-10-CM

## 2022-08-26 DIAGNOSIS — Z01818 Encounter for other preprocedural examination: Secondary | ICD-10-CM

## 2022-08-26 DIAGNOSIS — N8110 Cystocele, unspecified: Secondary | ICD-10-CM

## 2022-08-26 NOTE — Progress Notes (Signed)
Pt called in to surgery center reporting that she would not have a caregiver or driver for procedure today. Discussed options for transportation and possibility to stay overnight. Pt states she will not have anyone to take care of her at home and feels it would be best to cancel. Instructed pt to call MD office this morning to notify them and get next steps. OR coordinator aware.

## 2022-10-13 ENCOUNTER — Encounter (HOSPITAL_BASED_OUTPATIENT_CLINIC_OR_DEPARTMENT_OTHER): Payer: Self-pay | Admitting: Obstetrics and Gynecology

## 2022-10-13 ENCOUNTER — Other Ambulatory Visit: Payer: Self-pay

## 2022-10-13 DIAGNOSIS — Z01812 Encounter for preprocedural laboratory examination: Secondary | ICD-10-CM | POA: Diagnosis present

## 2022-10-13 NOTE — Progress Notes (Addendum)
Spoke w/ via phone for pre-op interview---Alison Sampson needs dos---urine pregnancy         Sampson results------10/22/22 Sampson appt for cbc, type & screen, bmp COVID test -----patient states asymptomatic no test needed Arrive at -------0600 on Thursday, 10/28/2022 NPO after MN NO Solid Food.  Clear liquids from MN until---0500 Med rec completed Medications to take morning of surgery -----Maxalt prn Diabetic medication -----Patient is has not taken Mounjaro since 12/2021. She will not resume until after surgery. Patient instructed no nail polish to be worn day of surgery Patient instructed to bring photo id and insurance card day of surgery Patient aware to have Driver (ride ) / caregiver    for 24 hours after surgery - husband, Alison Sampson : [facility] Patient Special Instructions -----Extended/ overnight stay instructions given. Follow instructions given for pre-op drinks the night before and morning of surgery. Finish all drinks by 0500 on the morning of surgery. Pre-Op special Instructions -----none Patient verbalized understanding of instructions that were given at this phone interview. Patient denies chest pain, sob, fever, cough at the interview.   Patient was previously scheduled for a hysterectomy on 08/26/22 and health history and medications were updated. But had to reschedule on 10/28/22. Patient stated no changes in health history or medications since 08/03/22.  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 She had no prior heart history. Cardiologist: Alison Guadalajara, MD, LOV 04/05/22 in chart & Epic. 02/12/22 CT cardiac scoring in Epic = 0 01/26/2022 Echo in Epic, EF 60 - 65% Patient denies chest pain, sob, and heart palpitations. Cardiac Clearance in chart and Epic dated 08/09/22.  Patient has a hx of seizures after a concussion in 2013. Per patient, seizures stopped in 2019. She states no seizures since 2019 and no longer takes seizure meds.   I  reviewed this case with Dr. Renold Don, MDA on 10/27/2022. Per Dr. Renold Don, OK to proceed with surgery at [facility].

## 2022-10-13 NOTE — Progress Notes (Addendum)
Your procedure is scheduled on Thursday, 10/28/22.  Report to [facility] AT  6:00 AM.   Call this number if you have problems the morning of surgery  :[phone].   OUR ADDRESS IS [address].  WE ARE LOCATED IN [facility].  PLEASE BRING YOUR INSURANCE CARD AND PHOTO ID DAY OF SURGERY.  ONLY 2 PEOPLE ARE ALLOWED IN  WAITING  ROOM                                      REMEMBER:  DO NOT EAT FOOD, CANDY GUM OR MINTS  AFTER MIDNIGHT THE NIGHT BEFORE YOUR SURGERY . YOU MAY HAVE CLEAR LIQUIDS FROM MIDNIGHT THE NIGHT BEFORE YOUR SURGERY UNTIL  5:00 AM. NO CLEAR LIQUIDS AFTER   5:00 AM DAY OF SURGERY.  DRINK TWO G2 DRINKS THE NIGHT BEFORE SURGERY FINISHING BY 10:00 PM. DRINK ONE G2 DRINK THE MORNING OF SURGERY ABOUT 3 1/2 HOURS BEFORE SURGERY FINISHING BY 5:00 AM.  YOU MAY  BRUSH YOUR TEETH MORNING OF SURGERY AND RINSE YOUR MOUTH OUT, NO CHEWING GUM CANDY OR MINTS.     CLEAR LIQUID DIET    Allowed      Water                                                                   Coffee and tea, regular and decaf  (NO cream or milk products of any type, may sweeten)                         Carbonated beverages, regular and diet                                    Sports drinks like Gatorade _____________________________________________________________________     TAKE ONLY THESE MEDICATIONS MORNING OF SURGERY: Maxalt if needed                                        DO NOT WEAR JEWERLY/  METAL/  PIERCINGS (INCLUDING NO PLASTIC PIERCINGS) DO NOT WEAR LOTIONS, POWDERS, PERFUMES OR NAIL POLISH ON YOUR FINGERNAILS. TOENAIL POLISH IS OK TO WEAR. DO NOT SHAVE FOR 48 HOURS PRIOR TO DAY OF SURGERY.  CONTACTS, GLASSES, OR DENTURES MAY NOT BE WORN TO SURGERY.  REMEMBER: NO SMOKING, VAPING ,  DRUGS OR ALCOHOL FOR 24 HOURS BEFORE YOUR SURGERY.                                    [facility] IS NOT RESPONSIBLE  FOR ANY BELONGINGS.                                                                     [facility]  [place] - Preparing for Surgery Before surgery, you can play an important role.  Because skin is not sterile, your skin needs to be as free of germs as possible.  You can reduce the number of germs on your skin by washing with CHG (chlorahexidine gluconate) soap before surgery.  CHG is an antiseptic cleaner which kills germs and bonds with the skin to continue killing germs even after washing. Please DO NOT use if you have an allergy to CHG or antibacterial soaps.  If your skin becomes reddened/irritated stop using the CHG and inform your nurse when you arrive at Short Stay. Do not shave (including legs and underarms) for at least 48 hours prior to the first CHG shower.  You may shave your face/neck. Please follow these instructions carefully:  1.  Shower with CHG Soap the night before surgery and the  morning of Surgery.  2.  If you choose to wash your hair, wash your hair first as usual with your  normal  shampoo.  3.  After you shampoo, rinse your hair and body thoroughly to remove the  shampoo.                                        4.  Use CHG as you would any other liquid soap.  You can apply chg directly  to the skin and wash , chg soap provided, night before and morning of your surgery.  5.  Apply the CHG Soap to your body ONLY FROM THE NECK DOWN.   Do not use on face/ open                           Wound or open sores. Avoid contact with eyes, ears mouth and genitals (private parts).                       Wash face,  Genitals (private parts) with your normal soap.             6.  Wash thoroughly, paying special attention to the area where your surgery  will be performed.  7.  Thoroughly rinse your body with warm water from the neck down.  8.  DO NOT shower/wash with your normal soap after using and rinsing off  the CHG Soap.             9.  Pat yourself dry with a clean towel.            10.  Wear clean pajamas.            11.   Place clean sheets on your bed the night of your first shower and do not  sleep with pets. Day of Surgery : Do not apply any lotions/ powders the morning of surgery.  Please wear clean clothes to the hospital/surgery center.  IF YOU HAVE ANY SKIN IRRITATION OR PROBLEMS WITH THE SURGICAL SOAP, PLEASE GET A BAR OF GOLD DIAL SOAP AND SHOWER THE NIGHT BEFORE YOUR SURGERY AND THE MORNING OF YOUR SURGERY. PLEASE LET THE NURSE KNOW MORNING OF YOUR SURGERY IF YOU HAD ANY PROBLEMS WITH THE SURGICAL SOAP.   YOUR SURGEON MAY HAVE REQUESTED EXTENDED RECOVERY TIME AFTER YOUR SURGERY. IT COULD BE A  JUST A FEW HOURS  UP TO AN OVERNIGHT STAY.  YOUR SURGEON SHOULD HAVE DISCUSSED  THIS WITH YOU PRIOR TO YOUR SURGERY. IN THE EVENT YOU NEED TO STAY OVERNIGHT PLEASE REFER TO THE FOLLOWING GUIDELINES. YOU MAY HAVE UP TO 4 VISITORS  MAY VISIT IN THE EXTENDED RECOVERY ROOM UNTIL 800 PM ONLY.  ONE  VISITOR AGE 42 AND OVER MAY SPEND THE NIGHT AND MUST BE IN EXTENDED RECOVERY ROOM NO LATER THAN 800 PM . YOUR DISCHARGE TIME AFTER YOU SPEND THE NIGHT IS 900 AM THE MORNING AFTER YOUR SURGERY. YOU MAY PACK A SMALL OVERNIGHT BAG WITH TOILETRIES FOR YOUR OVERNIGHT STAY IF YOU WISH.  REGARDLESS OF IF YOU STAY OVER NIGHT OR ARE DISCHARGED THE SAME DAY YOU WILL BE REQUIRED TO HAVE A RESPONSIBLE ADULT (18 YRS OLD OR OLDER) STAY WITH YOU FOR AT LEAST THE FIRST 24 HOURS  YOUR PRESCRIPTION MEDICATIONS WILL BE PROVIDED DURING YOUR HOSPITAL STAY.  ________________________________________________________________________                                                        QUESTIONS Mechele Claude PRE OP NURSE PHONE [phone].

## 2022-10-22 ENCOUNTER — Encounter (HOSPITAL_COMMUNITY)
Admission: RE | Admit: 2022-10-22 | Discharge: 2022-10-22 | Disposition: A | Discharge status: Home / Self Care | Payer: 59 | Source: Ambulatory Visit | Attending: Obstetrics and Gynecology | Admitting: Obstetrics and Gynecology

## 2022-10-22 DIAGNOSIS — Z01818 Encounter for other preprocedural examination: Secondary | ICD-10-CM

## 2022-10-22 DIAGNOSIS — Z01812 Encounter for preprocedural laboratory examination: Secondary | ICD-10-CM | POA: Insufficient documentation

## 2022-10-22 LAB — BASIC METABOLIC PANEL
Anion gap: 10 (ref 5–15)
BUN: 7 mg/dL (ref 6–20)
CO2: 23 mmol/L (ref 22–32)
Calcium: 9.2 mg/dL (ref 8.9–10.3)
Chloride: 104 mmol/L (ref 98–111)
Creatinine, Ser: 0.48 mg/dL (ref 0.44–1.00)
GFR, Estimated: >60 mL/min (ref >60)
Glucose, Bld: 95 mg/dL (ref 70–99)
Potassium: 4.3 mmol/L (ref 3.5–5.1)
Sodium: 137 mmol/L (ref 135–145)

## 2022-10-22 LAB — CBC
HCT: 38 % (ref 36.0–46.0)
Hemoglobin: 12.4 g/dL (ref 12.0–15.0)
MCH: 26.6 pg (ref 26.0–34.0)
MCHC: 32.6 g/dL (ref 30.0–36.0)
MCV: 81.5 fL (ref 80.0–100.0)
Platelets: 345 10*3/uL (ref 150–400)
RBC: 4.66 MIL/uL (ref 3.87–5.11)
RDW: 14.1 % (ref 11.5–15.5)
WBC: 5.8 10*3/uL (ref 4.0–10.5)
nRBC: 0 % (ref 0.0–0.2)

## 2022-10-28 ENCOUNTER — Other Ambulatory Visit: Payer: Self-pay

## 2022-10-28 ENCOUNTER — Encounter (HOSPITAL_BASED_OUTPATIENT_CLINIC_OR_DEPARTMENT_OTHER): Payer: Self-pay | Admitting: Obstetrics and Gynecology

## 2022-10-28 ENCOUNTER — Ambulatory Visit (HOSPITAL_BASED_OUTPATIENT_CLINIC_OR_DEPARTMENT_OTHER)
Admission: RE | Admit: 2022-10-28 | Discharge: 2022-10-29 | Disposition: A | Discharge status: Home / Self Care | Payer: 59 | Attending: Obstetrics and Gynecology | Admitting: Obstetrics and Gynecology

## 2022-10-28 ENCOUNTER — Ambulatory Visit (HOSPITAL_BASED_OUTPATIENT_CLINIC_OR_DEPARTMENT_OTHER): Payer: Self-pay | Admitting: Anesthesiology

## 2022-10-28 ENCOUNTER — Encounter (HOSPITAL_BASED_OUTPATIENT_CLINIC_OR_DEPARTMENT_OTHER)
Admission: RE | Disposition: A | Discharge status: Home / Self Care | Payer: Self-pay | Source: Home / Self Care | Attending: Obstetrics and Gynecology

## 2022-10-28 ENCOUNTER — Ambulatory Visit (HOSPITAL_BASED_OUTPATIENT_CLINIC_OR_DEPARTMENT_OTHER): Payer: 59 | Admitting: Anesthesiology

## 2022-10-28 DIAGNOSIS — N8189 Other female genital prolapse: Secondary | ICD-10-CM | POA: Insufficient documentation

## 2022-10-28 DIAGNOSIS — N8110 Cystocele, unspecified: Secondary | ICD-10-CM | POA: Diagnosis present

## 2022-10-28 DIAGNOSIS — N939 Abnormal uterine and vaginal bleeding, unspecified: Secondary | ICD-10-CM | POA: Diagnosis present

## 2022-10-28 DIAGNOSIS — D259 Leiomyoma of uterus, unspecified: Secondary | ICD-10-CM | POA: Diagnosis not present

## 2022-10-28 DIAGNOSIS — N814 Uterovaginal prolapse, unspecified: Secondary | ICD-10-CM | POA: Diagnosis present

## 2022-10-28 DIAGNOSIS — D251 Intramural leiomyoma of uterus: Secondary | ICD-10-CM | POA: Insufficient documentation

## 2022-10-28 DIAGNOSIS — Z9851 Tubal ligation status: Secondary | ICD-10-CM | POA: Diagnosis not present

## 2022-10-28 DIAGNOSIS — Z8674 Personal history of sudden cardiac arrest: Secondary | ICD-10-CM | POA: Insufficient documentation

## 2022-10-28 DIAGNOSIS — E119 Type 2 diabetes mellitus without complications: Secondary | ICD-10-CM | POA: Insufficient documentation

## 2022-10-28 DIAGNOSIS — Z01818 Encounter for other preprocedural examination: Secondary | ICD-10-CM

## 2022-10-28 DIAGNOSIS — D252 Subserosal leiomyoma of uterus: Secondary | ICD-10-CM | POA: Diagnosis not present

## 2022-10-28 DIAGNOSIS — N816 Rectocele: Secondary | ICD-10-CM

## 2022-10-28 HISTORY — PX: ANTERIOR AND POSTERIOR REPAIR: SHX5121

## 2022-10-28 HISTORY — DX: Presence of spectacles and contact lenses: Z97.3

## 2022-10-28 HISTORY — DX: Other amnesia: R41.3

## 2022-10-28 HISTORY — PX: ROBOTIC ASSISTED LAPAROSCOPIC HYSTERECTOMY AND SALPINGECTOMY: SHX6379

## 2022-10-28 HISTORY — DX: Anxiety disorder, unspecified: F41.9

## 2022-10-28 HISTORY — DX: Polyneuropathy, unspecified: G62.9

## 2022-10-28 HISTORY — DX: Type 2 diabetes mellitus without complications: E11.9

## 2022-10-28 HISTORY — PX: CYSTOSCOPY: SHX5120

## 2022-10-28 LAB — TYPE AND SCREEN
ABO/RH(D): B POS
Antibody Screen: NEGATIVE

## 2022-10-28 LAB — GLUCOSE, CAPILLARY
Glucose-Capillary: 114 mg/dL — ABNORMAL HIGH (ref 70–99)
Glucose-Capillary: 138 mg/dL — ABNORMAL HIGH (ref 70–99)

## 2022-10-28 LAB — POCT PREGNANCY, URINE: Preg Test, Ur: NEGATIVE

## 2022-10-28 SURGERY — XI ROBOTIC ASSISTED LAPAROSCOPIC HYSTERECTOMY AND SALPINGECTOMY
Anesthesia: General | Site: Vagina

## 2022-10-28 MED ORDER — OXYCODONE HCL 5 MG/5ML PO SOLN: 5.0000 mg | Freq: Once | ORAL | Status: DC | PRN

## 2022-10-28 MED ORDER — POVIDONE-IODINE 10 % EX SWAB
2.0000 | Freq: Once | CUTANEOUS | Status: AC
  Administered 2022-10-28: 2 via TOPICAL

## 2022-10-28 MED ORDER — LIDOCAINE HCL (PF) 2 % IJ SOLN
INTRAMUSCULAR | Status: AC
  Filled 2022-10-28: qty 10

## 2022-10-28 MED ORDER — PROPOFOL 500 MG/50ML IV EMUL
INTRAVENOUS | Status: AC
  Filled 2022-10-28: qty 50

## 2022-10-28 MED ORDER — SIMETHICONE 80 MG PO CHEW: 80.0000 mg | CHEWABLE_TABLET | Freq: Four times a day (QID) | ORAL | 0 refills | Status: AC | PRN

## 2022-10-28 MED ORDER — IBUPROFEN 200 MG PO TABS
600.0000 mg | ORAL_TABLET | Freq: Four times a day (QID) | ORAL | Status: DC
  Administered 2022-10-28 – 2022-10-29 (×3): 600 mg via ORAL

## 2022-10-28 MED ORDER — LIDOCAINE-EPINEPHRINE (PF) 1 %-1:200000 IJ SOLN
INTRAMUSCULAR | Status: DC | PRN
  Administered 2022-10-28: 25 mL

## 2022-10-28 MED ORDER — HEMOSTATIC AGENTS (NO CHARGE) OPTIME
TOPICAL | Status: DC | PRN
Start: 2022-10-28 — End: 2022-10-28
  Administered 2022-10-28: 1

## 2022-10-28 MED ORDER — SIMETHICONE 80 MG PO CHEW: 80.0000 mg | CHEWABLE_TABLET | Freq: Four times a day (QID) | ORAL | Status: DC | PRN

## 2022-10-28 MED ORDER — CEFAZOLIN SODIUM 1 G IJ SOLR
INTRAMUSCULAR | Status: AC
  Filled 2022-10-28: qty 20

## 2022-10-28 MED ORDER — SODIUM CHLORIDE 0.9 % IR SOLN
Status: DC | PRN
  Administered 2022-10-28: 1000 mL
  Administered 2022-10-28: 200 mL

## 2022-10-28 MED ORDER — HYDROCORT-PRAMOXINE (PERIANAL) 1-1 % EX FOAM
1.0000 | Freq: Two times a day (BID) | CUTANEOUS | Status: DC
  Administered 2022-10-28: 1 via RECTAL
  Filled 2022-10-28: qty 10

## 2022-10-28 MED ORDER — DEXAMETHASONE SODIUM PHOSPHATE 10 MG/ML IJ SOLN
INTRAMUSCULAR | Status: DC | PRN
  Administered 2022-10-28: 10 mg via INTRAVENOUS

## 2022-10-28 MED ORDER — ACETAMINOPHEN 10 MG/ML IV SOLN: 1000.0000 mg | Freq: Once | INTRAVENOUS | Status: DC | PRN

## 2022-10-28 MED ORDER — ROCURONIUM BROMIDE 10 MG/ML (PF) SYRINGE
PREFILLED_SYRINGE | INTRAVENOUS | Status: AC
  Filled 2022-10-28: qty 10

## 2022-10-28 MED ORDER — CEFAZOLIN SODIUM-DEXTROSE 2-4 GM/100ML-% IV SOLN
2.0000 g | INTRAVENOUS | Status: AC
  Administered 2022-10-28 (×2): 2 g via INTRAVENOUS

## 2022-10-28 MED ORDER — ROCURONIUM BROMIDE 10 MG/ML (PF) SYRINGE
PREFILLED_SYRINGE | INTRAVENOUS | Status: DC | PRN
  Administered 2022-10-28: 30 mg via INTRAVENOUS
  Administered 2022-10-28: 10 mg via INTRAVENOUS
  Administered 2022-10-28: 70 mg via INTRAVENOUS
  Administered 2022-10-28 (×2): 20 mg via INTRAVENOUS

## 2022-10-28 MED ORDER — ACETAMINOPHEN 500 MG PO TABS
ORAL_TABLET | ORAL | Status: AC
  Filled 2022-10-28: qty 2

## 2022-10-28 MED ORDER — ONDANSETRON HCL 4 MG/2ML IJ SOLN: 4.0000 mg | Freq: Four times a day (QID) | INTRAMUSCULAR | Status: DC | PRN

## 2022-10-28 MED ORDER — DOCUSATE SODIUM 100 MG PO CAPS: 100.0000 mg | ORAL_CAPSULE | Freq: Two times a day (BID) | ORAL | 0 refills | Status: AC

## 2022-10-28 MED ORDER — PROPOFOL 1000 MG/100ML IV EMUL
INTRAVENOUS | Status: AC
  Filled 2022-10-28: qty 100

## 2022-10-28 MED ORDER — ENSURE PRE-SURGERY PO LIQD: 592.0000 mL | Freq: Once | ORAL | Status: DC

## 2022-10-28 MED ORDER — MIDAZOLAM HCL 2 MG/2ML IJ SOLN
INTRAMUSCULAR | Status: AC
  Filled 2022-10-28: qty 2

## 2022-10-28 MED ORDER — FENTANYL CITRATE (PF) 100 MCG/2ML IJ SOLN
INTRAMUSCULAR | Status: AC
  Filled 2022-10-28: qty 2

## 2022-10-28 MED ORDER — LIDOCAINE 2% (20 MG/ML) 5 ML SYRINGE
INTRAMUSCULAR | Status: DC | PRN
  Administered 2022-10-28: 80 mg via INTRAVENOUS

## 2022-10-28 MED ORDER — ACETAMINOPHEN 500 MG PO TABS
1000.0000 mg | ORAL_TABLET | Freq: Four times a day (QID) | ORAL | Status: DC
  Administered 2022-10-28 – 2022-10-29 (×3): 1000 mg via ORAL

## 2022-10-28 MED ORDER — DEXMEDETOMIDINE HCL IN NACL 80 MCG/20ML IV SOLN
INTRAVENOUS | Status: AC
  Filled 2022-10-28: qty 20

## 2022-10-28 MED ORDER — IBUPROFEN 200 MG PO TABS
ORAL_TABLET | ORAL | Status: AC
  Filled 2022-10-28: qty 3

## 2022-10-28 MED ORDER — SODIUM CHLORIDE 0.9 % IV SOLN
INTRAVENOUS | Status: DC | PRN
  Administered 2022-10-28: 10 mL
  Administered 2022-10-28: 44 mL
  Administered 2022-10-28: 60 mL

## 2022-10-28 MED ORDER — MENTHOL 3 MG MT LOZG: 1.0000 | LOZENGE | OROMUCOSAL | Status: DC | PRN

## 2022-10-28 MED ORDER — FENTANYL CITRATE (PF) 100 MCG/2ML IJ SOLN: 25.0000 ug | INTRAMUSCULAR | Status: DC | PRN

## 2022-10-28 MED ORDER — OXYCODONE HCL 5 MG PO TABS: 5.0000 mg | ORAL_TABLET | ORAL | Status: DC | PRN

## 2022-10-28 MED ORDER — STERILE WATER FOR IRRIGATION IR SOLN
Status: DC | PRN
  Administered 2022-10-28 (×2): 500 mL

## 2022-10-28 MED ORDER — GABAPENTIN 300 MG PO CAPS
ORAL_CAPSULE | ORAL | Status: AC
  Filled 2022-10-28: qty 1

## 2022-10-28 MED ORDER — SUGAMMADEX SODIUM 200 MG/2ML IV SOLN
INTRAVENOUS | Status: DC | PRN
  Administered 2022-10-28: 175 mg via INTRAVENOUS

## 2022-10-28 MED ORDER — LACTATED RINGERS IV SOLN
INTRAVENOUS | Status: DC
  Administered 2022-10-28: 1000 mL via INTRAVENOUS

## 2022-10-28 MED ORDER — FENTANYL CITRATE (PF) 100 MCG/2ML IJ SOLN
INTRAMUSCULAR | Status: DC | PRN
  Administered 2022-10-28 (×5): 25 ug via INTRAVENOUS
  Administered 2022-10-28: 50 ug via INTRAVENOUS
  Administered 2022-10-28: 25 ug via INTRAVENOUS

## 2022-10-28 MED ORDER — CELECOXIB 200 MG PO CAPS
400.0000 mg | ORAL_CAPSULE | ORAL | Status: AC
  Administered 2022-10-28: 400 mg via ORAL

## 2022-10-28 MED ORDER — ENSURE PRE-SURGERY PO LIQD: 296.0000 mL | Freq: Once | ORAL | Status: DC

## 2022-10-28 MED ORDER — PROPOFOL 10 MG/ML IV BOLUS
INTRAVENOUS | Status: DC | PRN
Start: 2022-10-28 — End: 2022-10-28
  Administered 2022-10-28: 180 mg via INTRAVENOUS

## 2022-10-28 MED ORDER — PROPOFOL 500 MG/50ML IV EMUL
INTRAVENOUS | Status: DC | PRN
Start: 2022-10-28 — End: 2022-10-28
  Administered 2022-10-28: 200 ug/kg/min via INTRAVENOUS

## 2022-10-28 MED ORDER — LACTATED RINGERS IV SOLN: INTRAVENOUS | Status: DC

## 2022-10-28 MED ORDER — PROPOFOL 10 MG/ML IV BOLUS
INTRAVENOUS | Status: AC
  Filled 2022-10-28: qty 20

## 2022-10-28 MED ORDER — ONDANSETRON HCL 4 MG PO TABS: 4.0000 mg | ORAL_TABLET | Freq: Four times a day (QID) | ORAL | Status: DC | PRN

## 2022-10-28 MED ORDER — ACETAMINOPHEN 500 MG PO TABS
1000.0000 mg | ORAL_TABLET | ORAL | Status: AC
  Administered 2022-10-28: 1000 mg via ORAL

## 2022-10-28 MED ORDER — ONDANSETRON HCL 4 MG/2ML IJ SOLN
INTRAMUSCULAR | Status: AC
  Filled 2022-10-28: qty 2

## 2022-10-28 MED ORDER — DOCUSATE SODIUM 100 MG PO CAPS
100.0000 mg | ORAL_CAPSULE | Freq: Two times a day (BID) | ORAL | Status: DC
  Administered 2022-10-28: 100 mg via ORAL

## 2022-10-28 MED ORDER — DEXAMETHASONE SODIUM PHOSPHATE 10 MG/ML IJ SOLN
INTRAMUSCULAR | Status: AC
  Filled 2022-10-28: qty 1

## 2022-10-28 MED ORDER — OXYCODONE HCL 5 MG PO TABS: 5.0000 mg | ORAL_TABLET | Freq: Once | ORAL | Status: DC | PRN

## 2022-10-28 MED ORDER — CELECOXIB 200 MG PO CAPS
ORAL_CAPSULE | ORAL | Status: AC
  Filled 2022-10-28: qty 2

## 2022-10-28 MED ORDER — DOCUSATE SODIUM 100 MG PO CAPS
ORAL_CAPSULE | ORAL | Status: AC
  Filled 2022-10-28: qty 1

## 2022-10-28 MED ORDER — SCOPOLAMINE 1 MG/3DAYS TD PT72
1.0000 | MEDICATED_PATCH | TRANSDERMAL | Status: DC
  Administered 2022-10-28: 1.5 mg via TRANSDERMAL

## 2022-10-28 MED ORDER — GABAPENTIN 300 MG PO CAPS
300.0000 mg | ORAL_CAPSULE | ORAL | Status: AC
  Administered 2022-10-28: 300 mg via ORAL

## 2022-10-28 MED ORDER — ONDANSETRON HCL 4 MG PO TABS: 4.0000 mg | ORAL_TABLET | Freq: Four times a day (QID) | ORAL | 0 refills | Status: AC | PRN

## 2022-10-28 MED ORDER — ONDANSETRON HCL 4 MG/2ML IJ SOLN: 4.0000 mg | Freq: Once | INTRAMUSCULAR | Status: DC | PRN

## 2022-10-28 MED ORDER — MIDAZOLAM HCL 5 MG/5ML IJ SOLN
INTRAMUSCULAR | Status: DC | PRN
  Administered 2022-10-28: 2 mg via INTRAVENOUS

## 2022-10-28 MED ORDER — ONDANSETRON HCL 4 MG/2ML IJ SOLN
INTRAMUSCULAR | Status: DC | PRN
  Administered 2022-10-28: 4 mg via INTRAVENOUS

## 2022-10-28 MED ORDER — DEXMEDETOMIDINE HCL IN NACL 80 MCG/20ML IV SOLN
INTRAVENOUS | Status: DC | PRN
  Administered 2022-10-28 (×6): 4 ug via INTRAVENOUS

## 2022-10-28 MED ORDER — HYDROCORT-PRAMOXINE (PERIANAL) 2.5-1 % EX CREA: TOPICAL_CREAM | Freq: Three times a day (TID) | CUTANEOUS | 0 refills | Status: AC

## 2022-10-28 MED ORDER — CEFAZOLIN SODIUM-DEXTROSE 2-4 GM/100ML-% IV SOLN
INTRAVENOUS | Status: AC
  Filled 2022-10-28: qty 100

## 2022-10-28 MED ORDER — ESTRADIOL 0.1 MG/GM VA CREA
TOPICAL_CREAM | VAGINAL | Status: DC | PRN
  Administered 2022-10-28: 1 via VAGINAL

## 2022-10-28 MED ORDER — SCOPOLAMINE 1 MG/3DAYS TD PT72
MEDICATED_PATCH | TRANSDERMAL | Status: AC
  Filled 2022-10-28: qty 1

## 2022-10-28 SURGICAL SUPPLY — 90 items
ADH SKN CLS APL DERMABOND .7 (GAUZE/BANDAGES/DRESSINGS) ×3
APL SKNCLS STERI-STRIP NONHPOA (GAUZE/BANDAGES/DRESSINGS) ×3
BAG DRN RND TRDRP ANRFLXCHMBR (UROLOGICAL SUPPLIES) ×3
BAG URINE DRAIN 2000ML AR STRL (UROLOGICAL SUPPLIES) ×3 IMPLANT
BARRIER ADHS 3X4 INTERCEED (GAUZE/BANDAGES/DRESSINGS) ×3 IMPLANT
BENZOIN TINCTURE PRP APPL 2/3 (GAUZE/BANDAGES/DRESSINGS) IMPLANT
BRR ADH 4X3 ABS CNTRL BYND (GAUZE/BANDAGES/DRESSINGS) ×3
CATH FOLEY 2WAY SLVR 5CC 14FR (CATHETERS) IMPLANT
CATH FOLEY 3WAY 5CC 16FR (CATHETERS) ×3 IMPLANT
CNTNR URN SCR LID CUP LEK RST (MISCELLANEOUS) IMPLANT
CONT SPEC 4OZ STRL OR WHT (MISCELLANEOUS) ×3
COVER BACK TABLE 60X90IN (DRAPES) ×3 IMPLANT
COVER TIP SHEARS 8 DVNC (MISCELLANEOUS) ×3 IMPLANT
DEFOGGER SCOPE WARMER CLEARIFY (MISCELLANEOUS) ×3 IMPLANT
DERMABOND ADVANCED .7 DNX12 (GAUZE/BANDAGES/DRESSINGS) ×3 IMPLANT
DILATOR CANAL MILEX (MISCELLANEOUS) IMPLANT
DRAPE ARM DVNC X/XI (DISPOSABLE) ×12 IMPLANT
DRAPE COLUMN DVNC XI (DISPOSABLE) ×3 IMPLANT
DRAPE SURG IRRIG POUCH 19X23 (DRAPES) ×3 IMPLANT
DRAPE UTILITY XL STRL (DRAPES) ×3 IMPLANT
DRIVER NDL MEGA SUTCUT DVNCXI (INSTRUMENTS) ×3 IMPLANT
DRIVER NDLE MEGA SUTCUT DVNCXI (INSTRUMENTS) ×3 IMPLANT
DURAPREP 26ML APPLICATOR (WOUND CARE) ×3 IMPLANT
ELECT REM PT RETURN 9FT ADLT (ELECTROSURGICAL) ×3
ELECTRODE REM PT RTRN 9FT ADLT (ELECTROSURGICAL) ×3 IMPLANT
FORCEPS BPLR LNG DVNC XI (INSTRUMENTS) ×3 IMPLANT
FORCEPS LONG TIP 8 DVNC XI (FORCEP) ×3 IMPLANT
FORCEPS MICRO DMD BLK DVNC XI (FORCEP) IMPLANT
FORCEPS PROGRASP DVNC XI (FORCEP) ×3 IMPLANT
GAUZE 4X4 16PLY ~~LOC~~+RFID DBL (SPONGE) IMPLANT
GAUZE PACKING 1INX5YD STRL (GAUZE/BANDAGES/DRESSINGS) ×3 IMPLANT
GAUZE PETROLATUM 1 X8 (GAUZE/BANDAGES/DRESSINGS) ×3 IMPLANT
GLOVE BIO SURGEON STRL SZ 6.5 (GLOVE) IMPLANT
GLOVE BIO SURGEON STRL SZ7 (GLOVE) ×3 IMPLANT
GLOVE BIOGEL PI IND STRL 7.0 (GLOVE) ×9 IMPLANT
GLOVE ECLIPSE 6.5 STRL STRAW (GLOVE) ×9 IMPLANT
GLOVE SURG SS PI 6.5 STRL IVOR (GLOVE) IMPLANT
GOWN STRL REUS W/ TWL LRG LVL3 (GOWN DISPOSABLE) IMPLANT
GOWN STRL REUS W/TWL LRG LVL3 (GOWN DISPOSABLE) ×12 IMPLANT
HOLDER FOLEY CATH W/STRAP (MISCELLANEOUS) IMPLANT
IRRIG SUCT STRYKERFLOW 2 WTIP (MISCELLANEOUS) ×3
IRRIGATION SUCT STRKRFLW 2 WTP (MISCELLANEOUS) ×3 IMPLANT
IV NS 1000ML (IV SOLUTION) ×6
IV NS 1000ML BAXH (IV SOLUTION) IMPLANT
KIT PINK PAD W/HEAD ARE REST (MISCELLANEOUS) ×3
KIT PINK PAD W/HEAD ARM REST (MISCELLANEOUS) ×3 IMPLANT
KIT TURNOVER CYSTO (KITS) ×3 IMPLANT
LEGGING LITHOTOMY PAIR STRL (DRAPES) ×3 IMPLANT
NDL HYPO 22X1.5 SAFETY MO (MISCELLANEOUS) ×3 IMPLANT
NEEDLE HYPO 22X1.5 SAFETY MO (MISCELLANEOUS) ×3 IMPLANT
NS IRRIG 1000ML POUR BTL (IV SOLUTION) ×3 IMPLANT
OBTURATOR OPTICAL STND 8 DVNC (TROCAR) ×3
OBTURATOR OPTICALSTD 8 DVNC (TROCAR) ×3 IMPLANT
OCCLUDER COLPOPNEUMO (BALLOONS) ×3 IMPLANT
PACK ROBOT WH (CUSTOM PROCEDURE TRAY) ×3 IMPLANT
PACK ROBOTIC GOWN (GOWN DISPOSABLE) ×3 IMPLANT
PACK VAGINAL WOMENS (CUSTOM PROCEDURE TRAY) ×3 IMPLANT
PAD OB MATERNITY 4.3X12.25 (PERSONAL CARE ITEMS) ×3 IMPLANT
PAD PREP 24X48 CUFFED NSTRL (MISCELLANEOUS) ×3 IMPLANT
POUCH LAPAROSCOPIC INSTRUMENT (MISCELLANEOUS) ×3 IMPLANT
PROTECTOR NERVE ULNAR (MISCELLANEOUS) ×9 IMPLANT
SCISSORS MNPLR CVD DVNC XI (INSTRUMENTS) ×3 IMPLANT
SEAL UNIV 5-12 XI (MISCELLANEOUS) ×12 IMPLANT
SEALER VESSEL EXT DVNC XI (MISCELLANEOUS) IMPLANT
SET IRRIG Y TYPE TUR BLADDER L (SET/KITS/TRAYS/PACK) IMPLANT
SET TRI-LUMEN FLTR TB AIRSEAL (TUBING) ×3 IMPLANT
SLEEVE SCD COMPRESS KNEE MED (STOCKING) ×3 IMPLANT
SPIKE FLUID TRANSFER (MISCELLANEOUS) ×6 IMPLANT
STRIP CLOSURE SKIN 1/4X4 (GAUZE/BANDAGES/DRESSINGS) ×3 IMPLANT
SUT MNCRL AB 3-0 PS2 27 (SUTURE) ×6 IMPLANT
SUT VIC AB 0 CT1 27 (SUTURE) ×6
SUT VIC AB 0 CT1 27XBRD ANBCTR (SUTURE) ×6 IMPLANT
SUT VIC AB 2-0 CT2 27 (SUTURE) ×12 IMPLANT
SUT VIC AB 2-0 SH 27 (SUTURE) ×9
SUT VIC AB 2-0 SH 27XBRD (SUTURE) ×12 IMPLANT
SUT VIC AB 3-0 CT1 27 (SUTURE) ×15
SUT VIC AB 3-0 CT1 36 (SUTURE) IMPLANT
SUT VIC AB 3-0 CT1 TAPERPNT 27 (SUTURE) IMPLANT
SUT VICRYL 0 UR6 27IN ABS (SUTURE) ×3 IMPLANT
SUT VLOC 180 0 9IN GS21 (SUTURE) ×6 IMPLANT
TIP RUMI ORANGE 6.7MMX12CM (TIP) IMPLANT
TIP UTERINE 5.1X6CM LAV DISP (MISCELLANEOUS) IMPLANT
TIP UTERINE 6.7X10CM GRN DISP (MISCELLANEOUS) IMPLANT
TIP UTERINE 6.7X6CM WHT DISP (MISCELLANEOUS) IMPLANT
TIP UTERINE 6.7X8CM BLUE DISP (MISCELLANEOUS) IMPLANT
TOWEL OR 17X24 6PK STRL BLUE (TOWEL DISPOSABLE) ×3 IMPLANT
TRAY FOLEY W/BAG SLVR 14FR LF (SET/KITS/TRAYS/PACK) ×3 IMPLANT
TROCAR PORT AIRSEAL 8X120 (TROCAR) ×3 IMPLANT
WATER STERILE IRR 1000ML POUR (IV SOLUTION) ×3 IMPLANT
WATER STERILE IRR 500ML POUR (IV SOLUTION) IMPLANT

## 2022-10-28 NOTE — Discharge Instructions (Addendum)
Call [facility] at [phone] if:   You have a temperature greater than or equal to 100.4 degrees Farenheit orally You have pain that is not made better by the pain medication given and taken as directed You have excessive bleeding or problems urinating  Take Colace (Docusate Sodium/Stool Softener) 100 mg 2-3 times daily while taking narcotic pain medicine to avoid constipation or until bowel movements are regular.  Take with food Ibuprofen 600 mg and Acetaminophen 500 mg  #2 tablets every 6 hours for 5 days then as needed for post operative pain  You may drive after 2 weeks You may walk up steps (slowly)  You may shower tomorrow You may resume a regular diet  Keep incisions clean and dry Do not lift over 15 pounds for 6 weeks Avoid anything in vagina for 6 weeks

## 2022-10-28 NOTE — Anesthesia Procedure Notes (Signed)
Procedure Name: Intubation Date/Time: 10/28/2022 8:08 AM  Performed by: Kwinton Maahs D, CRNAPre-anesthesia Checklist: Patient identified, Emergency Drugs available, Suction available and Patient being monitored Patient Re-evaluated:Patient Re-evaluated prior to induction Oxygen Delivery Method: Circle system utilized Preoxygenation: Pre-oxygenation with 100% oxygen Induction Type: IV induction Ventilation: Mask ventilation without difficulty Laryngoscope Size: Mac and 3 Grade View: Grade I Tube type: Oral Tube size: 7.0 mm Number of attempts: 1 Airway Equipment and Method: Stylet and Oral airway Placement Confirmation: ETT inserted through vocal cords under direct vision, positive ETCO2 and breath sounds checked- equal and bilateral Secured at: 21 cm Tube secured with: Tape Dental Injury: Teeth and Oropharynx as per pre-operative assessment

## 2022-10-28 NOTE — Progress Notes (Signed)
Day of Surgery Procedure(s) (LRB): XI ROBOTIC ASSISTED LAPAROSCOPIC HYSTERECTOMY AND SALPINGECTOMY (N/A) ANTERIOR (CYSTOCELE) AND POSTERIOR REPAIR (RECTOCELE) (N/A) CYSTOSCOPY  Subjective: Patient reports that pain is well managed.  Tolerating normal diet as tolerated  without difficulty. No nausea / vomiting.  Has not ambulated or voided yet. Packing and Foley removed at 16:00   Objective: BP 117/71 (BP Location: Right Arm)   Pulse 69   Temp 99 F (37.2 C)   Resp 15   Ht 5\' 4"  (1.626 m)   Wt 81 kg   LMP 10/06/2022 (Exact Date)   SpO2 100%   BMI 30.64 kg/m  Incision: dry  Assessment: s/p Procedure(s): XI ROBOTIC ASSISTED LAPAROSCOPIC HYSTERECTOMY AND SALPINGECTOMY ANTERIOR (CYSTOCELE) AND POSTERIOR REPAIR (RECTOCELE) CYSTOSCOPY: progressing well  Plan: Advance diet Encourage ambulation Voiding trial: if successful may discharge home tonight  Op findings reviewed with patient and husband. Questions answered.   LOS: 0 days    Crist Fat Kalkidan Caudell 10/28/2022, 4:46 PM

## 2022-10-28 NOTE — Progress Notes (Signed)
Patient given warm wash clothes to cleanse and wipe perineum after every trip to the bathroom.

## 2022-10-28 NOTE — Transfer of Care (Signed)
Immediate Anesthesia Transfer of Care Note  Patient: Alison Sampson  Procedure(s) Performed: XI ROBOTIC ASSISTED LAPAROSCOPIC HYSTERECTOMY AND SALPINGECTOMY (Pelvis) ANTERIOR (CYSTOCELE) AND POSTERIOR REPAIR (RECTOCELE) (Vagina ) CYSTOSCOPY (Bladder)  Patient Location: PACU  Anesthesia Type:General  Level of Consciousness: sedated  Airway & Oxygen Therapy: Patient Spontanous Breathing and Patient connected to nasal cannula oxygen  Post-op Assessment: Report given to RN and Post -op Vital signs reviewed and stable  Post vital signs: Reviewed and stable  Last Vitals:  Vitals Value Taken Time  BP 100/63 10/28/22 1255  Temp 37.2 C 10/28/22 1255  Pulse 91 10/28/22 1259  Resp 14 10/28/22 1259  SpO2 95 % 10/28/22 1259  Vitals shown include unfiled device data.  Last Pain:  Vitals:   10/28/22 0651  TempSrc: Oral  PainSc: 0-No pain      Patients Stated Pain Goal: 5 (10/28/22 1610)  Complications: No notable events documented.

## 2022-10-28 NOTE — Progress Notes (Signed)
Packing pulled with minimal amount of bleeding on packing.  Patient did have minimal amount of bleeding on peri pad and passed some bleeding in the commode. Patient tolerated this well.

## 2022-10-28 NOTE — Interval H&P Note (Signed)
History and Physical Interval Note:  10/28/2022 7:24 AM  Alison Sampson  has presented today for surgery, with the diagnosis of UTERINE FIBROIDS,ABNORMAL UTERINE BLEEDIN,CYSTOCELE,RECTOCELE.  The various methods of treatment have been discussed with the patient and family. After consideration of risks, benefits and other options for treatment, the patient has consented to  Procedure(s): XI ROBOTIC ASSISTED LAPAROSCOPIC HYSTERECTOMY AND SALPINGECTOMY (N/A) ANTERIOR (CYSTOCELE) AND POSTERIOR REPAIR (RECTOCELE) (N/A) as a surgical intervention.  The patient's history has been reviewed, patient examined, no change in status, stable for surgery.  I have reviewed the patient's chart and labs.  Questions were answered to the patient's satisfaction.     Dois Davenport A Tyneka Scafidi

## 2022-10-28 NOTE — Progress Notes (Signed)
RN assisted patient with sitz bath hoping to relax pelvic muscles and possibly help with the   discomfort of her hemorrhoids.

## 2022-10-28 NOTE — Op Note (Signed)
Preoperative diagnosis: Pelvic prolapse with uterine fibroids  Postoperative diagnosis: Same   Anesthesia: General  Anesthesiologist: Dr. Ace Gins  Procedure: Robotically assisted total hysterectomy with bilateral salpingectomy, anterior and posterior repair, cystoscopy  Surgeon: Dr. Dois Davenport Oanh Devivo  Assistant: Henreitta Leber P.A.-C.  Estimated blood loss: 100 cc  Procedure:  After being informed of the planned procedure with possible complications including but not limited to bleeding, infection, injury to other organs, need for laparotomy, expected hospital stay and recovery, informed consent is obtained and patient is taken to or #5. She is placed in  lithotomy position on Pink Pad with both arms padded and tucked on each side and bilateral knee-high sequential compressive devices. She is given general anesthesia with endotracheal intubation without any complication. She is prepped and draped in a sterile fashion. A three-way Foley catheter is inserted in her bladder.  Pelvic exam reveals: retroverted uterus increased in size to 12 weeks with 2 normal adnexa  A weighted speculum is inserted in the vagina and the anterior lip of the cervix is grasped with a tenaculum forcep. We proceed with a paracervical block and vaginal infiltration using ropivacaine 0.5% diluted 1 in 1 with saline. The uterus was then sounded at 10 cm. We easily dilate the cervix using Hegar dilator to  #27 which allows for easy placement of the intrauterine RUMI manipulator with a 3.0 KOH ring and a vaginal occluder. The ring is sutured to the cervix with 0 Vicryl.  Trocar placement is decided. We infiltrate the umbilicus with 10 cc of ropivacaine per protocol and perform a 10 mm semi-elliptical incision which is brought down bluntly to the fascia. The fascia is identified and grasped with Coker forceps. The fascia is incised with Mayo scissors. Peritoneum is entered bluntly. A pursestring suture of 0 Vicryl is placed on the  fascia and a 10 mm Hassan trocar is easily inserted in the abdominal cavity held in placed with a Purstring suture. This allows for easy insufflation of a pneumoperitoneum using warmed CO2 at a maximum pressure of 15 mm of mercury. 60 cc of Ropivacaine 0.5 % diluted 1 in 1 is sent in the pelvis and the patient is positioned in reverse Trendelenburg. We then placed two 8mm robotic trocar on the left, one 8mm robotic trocar on the right and one 8 mm patient's side assistant trocar on the right  after infiltrating every site  with ropivacaine per protocol. The robot is docked on the right of the patient after positioning her in Trendelenburg. A Vessel Seal is inserted in arm #4, a Long bipolar forceps is inserted in arm #2 and a Prograsp is inserted in arm #1.  Preparation and docking is completed in 46 minutes.  Observation: The uterus is retroverted and enlarged to approximately 12 to 14 weeks with a dominant fundal fibroid and a right lower uterine segment fibroid in the left lower uterine segment fibroid.  Both tubes show signs of previous tubal ligation but fimbriated are visible.  Both ovaries are normal.  Anterior and posterior cul-de-sac are normal.  Liver and gallbladder are seen and normal.  Appendix is not seen and there are no significant adhesions in the pelvis  We start on the right side by sealing and cutting the mesosalpynx , the right utero-ovarian ligament and the right round ligament .  This gives [place] entry into the retroperitoneal space with an easy dissection of the anterior broad ligament. The ligament was opened all the way to the left round ligament.   We  then proceed with systematic dissection of the bladder from the anterior vaginal cuff which is easily identified with the KOH ring. The plane of dissection is confirmed with filling the bladder with 200 cc of saline. We are able to dissect the bladder 2 cm below the KOH ring. We then opened the posterior right broad ligament all the way  to the posterior KOH ring after identifying the full course of the right ureter.   Moving to the left side we Seal and cut  the left round ligament , the left utero-ovarian ligament and  the mesosalpinx in between. Entry into the retroperitoneal space allows [place] to complete dissection of the bladder on the left side and skeletonized the uterine vessels. The left broad ligament is then dissected all the way to the posterior KOH ring after identifying the full course of the left ureter.  Both tubes are removed from the pelvic cavity through the assistant's trocar.   With pressure on the KOH ring and the bladder fully dissected, we cauterize and cut the uterine vessels on both sides at the level of the KOH ring.  The vaginal occluder is inflated and we proceed with a 360 colpotomy using an open monopolar scissors and freeing the uterus entirely.  The uterus is delivered vaginally with traction on;y. The vaginal occluder is reinserted in the vagina to maintain pneumoperitoneum.  Instruments are then modified for a suture cut in arm #4 and a long tip forcep in arm #2. We proceed with closure of the vaginal cuff with 2 running sutures of 0 V-Lock. We irrigated profusely with warm saline and confirm a satisfactory hemostasis as well as 2 normal ureters with good mobility and no dilatation.  A sheet of Interceed , divided in 2, is placed on the vaginal cuff and behind the ovaries.  All instruments are then removed and the robot is undocked. Console time: 1 hours and 27 minutes.  All trochars are removed under direct visualization after evacuating the pneumoperitoneum.  The fascia of the umbilical incision is closed with the previously placed pursestring suture of 0 Vicryl. All incisions are then closed with subcuticular suture of 3-0 Monocryl and Dermabond.  A speculum is inserted in the vagina to confirm a adequate closure of the vaginal cuff and good hemostasis.   Proceeding with anterior repair:  We isolate the anterior vaginal mucosa using Allis forceps and infiltrated using lidocaine 1% with epinephrine 1 and 200,000.  The vaginal mucosa is then undermined medially and sharply dissected all the way to 1 cm below the urethrovesical junction. We proceed with sharp and blunt dissection of the prevesical fascia until the cystocele was completely freed. We then correct the cystocele with multilayered U- stitches of 2-0 Vicryl until complete resolution. Excess of vaginal mucosa is excised. The anterior vaginal mucosa is then closed using figure-of-eight stitches of 3-0 Vicryl.   Proceeding with posterior repair: We grasped the posterior fourchette on a distance of 3 cm infiltrate the posterior vaginal mucosa using Lidocaine 1% with Epinephrine 1:200,000. We sharply dissect the posterior vaginal mucosa midline until 2 cm from the vaginal cuff. We then sharply and bluntly dissect this mucosa away from the perirectal fascia until the rectocele was completely mobilized. We then correct the rectocele will multilayered U-stitches of 2-0 Vicryl until complete resolution. The excess vaginal mucosa is excised and the posterior vaginal mucosa is closed with a running lock suture of 3-0 Vicryl. The perineal muscles are then reapproximated with simple sutures of 3-0 Vicryl. The  perineal skin is then closed with subcuticular suture of 3-0 Vicryl.   The Foley catheter is removed and we proceeded with the postoperative cystoscopy.  This reveals an intact bladder with 2 normal ureters.  Another Foley catheter is inserted for postop recovery.  Instruments and sponge count is complete x2.   The procedure is well tolerated by the patient who is taken to recovery room in a well and stable condition.  Dr Estanislado Pandy was present and scrubbed at all times. Surgical assistance was required due to the complexity of the anatomy and the vaginal approach of the procedure.    Estimated blood loss is 100 cc   Specimen:  None   Specimen: Uterus and tubes weighing 272 g sent to pathology

## 2022-10-28 NOTE — Progress Notes (Signed)
Patient requested to be saline locked.

## 2022-10-28 NOTE — Anesthesia Preprocedure Evaluation (Signed)
Anesthesia Evaluation  Patient identified by MRN, date of birth, ID band Patient awake  General Assessment Comment:Reports history of a cardiac arrest requiring defibrillation during a plastic surgery procedure 10 years ago. No records available. Has had cardiac workup - unrevealing.   Reviewed: Allergy & Precautions, NPO status , Patient's Chart, lab work & pertinent test results, reviewed documented beta blocker date and time   History of Anesthesia Complications (+) PONV and history of anesthetic complications  Airway Mallampati: II  TM Distance: >3 FB Neck ROM: Full    Dental no notable dental hx.    Pulmonary asthma    breath sounds clear to auscultation       Cardiovascular Exercise Tolerance: Good (-) hypertension(-) angina (-) CAD, (-) Past MI and (-) Cardiac Stents + dysrhythmias (see prior anesthetic complications - ? cardiac arrest)  Rhythm:Regular Rate:Normal     Neuro/Psych  Headaches, Seizures -, Well Controlled,   Neuromuscular disease    GI/Hepatic ,GERD  ,,(+) neg Cirrhosis        Endo/Other  diabetes, Type 2    Renal/GU      Musculoskeletal  (+) Arthritis ,    Abdominal   Peds  Hematology   Anesthesia Other Findings   Reproductive/Obstetrics                             Anesthesia Physical Anesthesia Plan  ASA: 2  Anesthesia Plan: General   Post-op Pain Management:    Induction: Intravenous  PONV Risk Score and Plan: 4 or greater and TIVA and Ondansetron  Airway Management Planned: Oral ETT  Additional Equipment:   Intra-op Plan:   Post-operative Plan: Extubation in OR  Informed Consent: I have reviewed the patients History and Physical, chart, labs and discussed the procedure including the risks, benefits and alternatives for the proposed anesthesia with the patient or authorized representative who has indicated his/her understanding and acceptance.      Dental advisory given  Plan Discussed with: CRNA  Anesthesia Plan Comments:        Anesthesia Quick Evaluation

## 2022-10-28 NOTE — Anesthesia Postprocedure Evaluation (Signed)
Anesthesia Post Note  Patient: Alison Sampson  Procedure(s) Performed: XI ROBOTIC ASSISTED LAPAROSCOPIC HYSTERECTOMY AND SALPINGECTOMY (Pelvis) ANTERIOR (CYSTOCELE) AND POSTERIOR REPAIR (RECTOCELE) (Vagina ) CYSTOSCOPY (Bladder)     Patient location during evaluation: PACU Anesthesia Type: General Level of consciousness: awake and alert Pain management: pain level controlled Vital Signs Assessment: post-procedure vital signs reviewed and stable Respiratory status: spontaneous breathing, nonlabored ventilation, respiratory function stable and patient connected to nasal cannula oxygen Cardiovascular status: blood pressure returned to baseline and stable Postop Assessment: no apparent nausea or vomiting Anesthetic complications: no   No notable events documented.  Last Vitals:  Vitals:   10/28/22 1315 10/28/22 1330  BP: 106/80 111/70  Pulse: 83 83  Resp: 10 12  Temp:    SpO2: 100% 96%    Last Pain:  Vitals:   10/28/22 1330  TempSrc:   PainSc: 0-No pain                 Mariann Barter

## 2022-10-29 DIAGNOSIS — N8189 Other female genital prolapse: Secondary | ICD-10-CM | POA: Diagnosis not present

## 2022-10-29 MED ORDER — ACETAMINOPHEN 500 MG PO TABS
ORAL_TABLET | ORAL | Status: AC
  Filled 2022-10-29: qty 2

## 2022-10-29 MED ORDER — IBUPROFEN 200 MG PO TABS
ORAL_TABLET | ORAL | Status: AC
  Filled 2022-10-29: qty 3

## 2022-10-29 NOTE — Progress Notes (Signed)
Alison Sampson is a51 y.o.  [id number]  Post Op Date # 1: Robot Assisted Laparoscopic Hysterectomy/BS/A-P Colporrhaphy  Subjective: Patient is Doing well postoperatively. Patient has Pain is controlled with current analgesics. Medications being used: acetaminophen and ibuprofen (OTC). Patient ambulates without difficulty, tolerating po, passing flatus and voiding without difficulty. Diet:regular diet Patient has voided without difficulty and passed flatus Yes.     Objective: Vital signs in last 24 hours: Temp:  [97.8 F (36.6 C)-99.9 F (37.7 C)] 99.2 F (37.3 C) (10/04 0610) Pulse Rate:  [67-83] 75 (10/04 0610) Resp:  [10-18] 14 (10/04 0610) BP: (96-127)/(63-80) 116/79 (10/04 0610) SpO2:  [96 %-100 %] 96 % (10/04 0610)  Intake/Output from previous day: 10/03 0701 - 10/04 0700 In: 2719.2 [P.O.:1140; I.V.:1579.2] Out: 2550 [Urine:2500] Intake/Output this shift: No intake/output data recorded. Recent Labs  Lab 10/22/22 1042  WBC 5.8  HGB 12.4  HCT 38.0  PLT 345     Recent Labs  Lab 10/22/22 1042  NA 137  K 4.3  CL 104  CO2 23  BUN 7  CREATININE 0.48  CALCIUM 9.2  GLUCOSE 95    EXAM: Resp: clear to auscultation bilaterally Cardio: regular rate and rhythm, S1, S2 normal, no murmur, click, rub or gallop GI: bowel sounds present, incisions intact without evidence of infection Extremities: no calf tenderness, swelling or discoloration Vaginal Bleeding: minimal   Assessment: s/p Procedure(s): XI ROBOTIC ASSISTED LAPAROSCOPIC HYSTERECTOMY AND SALPINGECTOMY ANTERIOR (CYSTOCELE) AND POSTERIOR REPAIR (RECTOCELE) CYSTOSCOPY: stable, progressing well, and tolerating diet  Plan: Discharge home  LOS: 0 days    Henreitta Leber, PA-C 10/29/2022 8:28 AM

## 2022-11-01 ENCOUNTER — Encounter (HOSPITAL_BASED_OUTPATIENT_CLINIC_OR_DEPARTMENT_OTHER): Payer: Self-pay | Admitting: Obstetrics and Gynecology

## 2022-11-01 LAB — SURGICAL PATHOLOGY

## 2023-02-07 ENCOUNTER — Ambulatory Visit: Payer: 59

## 2023-02-11 ENCOUNTER — Other Ambulatory Visit: Payer: Self-pay

## 2023-02-11 ENCOUNTER — Ambulatory Visit: Payer: 59 | Attending: Obstetrics and Gynecology

## 2023-02-11 DIAGNOSIS — R279 Unspecified lack of coordination: Secondary | ICD-10-CM | POA: Insufficient documentation

## 2023-02-11 DIAGNOSIS — M6281 Muscle weakness (generalized): Secondary | ICD-10-CM | POA: Insufficient documentation

## 2023-02-11 DIAGNOSIS — R293 Abnormal posture: Secondary | ICD-10-CM | POA: Insufficient documentation

## 2023-02-11 DIAGNOSIS — M62838 Other muscle spasm: Secondary | ICD-10-CM | POA: Diagnosis present

## 2023-02-11 NOTE — Patient Instructions (Signed)
Vulvar/vaginal Massage: This is a technique to help decrease painful sensitivity in the vaginal area. It can also help to restore normal moisture levels in the vaginal tissues. With coconut oil, aloe, jojoba oil, or a specific vaginal moisturizer, gently massage into vaginal tissues. Think of this as part of your post-shower routine and moisturizing just like you would the rest of the body with lotion. This helps to increase good blood flow to the vaginal tissues. In addition, it also teaches the body that touch to the vagina does not have to be painful or threatening, but moisturizing and gentle.   [facility] [address], [address] [place], [place] [address] Phone # [phone] Fax [phone]  

## 2023-02-11 NOTE — Therapy (Signed)
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   PONV (postoperative nausea and vomiting)    Seizures (HCC)    hx of seizures after concussion in 2013, no seizures since 2019, no longer on meds   Umbilical hernia    Vitamin D deficiency    Wears glasses    for distance vision only   Past Surgical History:  Procedure Laterality Date   ABDOMINOPLASTY  2014   ANTERIOR AND POSTERIOR REPAIR N/A 10/28/2022   Procedure:  ANTERIOR (CYSTOCELE) AND POSTERIOR REPAIR (RECTOCELE);  Surgeon: Silverio Lay, MD;  Location: [facility];  Service: Gynecology;  Laterality: N/A;   ARTHROSCOPIC REPAIR ACL  2010, 2011   ACL Repair x 2   BREAST ENHANCEMENT SURGERY  1990   implants   CESAREAN SECTION  1990   CYSTOSCOPY  10/28/2022   Procedure: CYSTOSCOPY;  Surgeon: Silverio Lay, MD;  Location: [facility];  Service: Gynecology;;   LUMBAR DISC SURGERY     x5 surgeries per pt; no hardware   REDUCTION MAMMAPLASTY Bilateral 2014   ROBOTIC ASSISTED LAPAROSCOPIC HYSTERECTOMY AND SALPINGECTOMY N/A 10/28/2022   Procedure: XI ROBOTIC ASSISTED LAPAROSCOPIC HYSTERECTOMY AND SALPINGECTOMY;  Surgeon: Silverio Lay, MD;  Location: [facility];  Service: Gynecology;  Laterality: N/A;   SALPINGECTOMY Left 2000   TUBAL LIGATION  2003   UMBILICAL HERNIA REPAIR  03/24/2011   Procedure: HERNIA REPAIR UMBILICAL ADULT;  Surgeon: Clovis Pu. 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 10/28/2022   Pelvic relaxation due to  vaginal prolapse 10/28/2022   Abnormal uterine bleeding 10/28/2022   Controlled type 2 diabetes mellitus without complication, without long-term current use of insulin (HCC) 10/10/2019   History of left oophorectomy 08/15/2019   Enlarged thyroid 04/06/2018   Palpitations 04/05/2018   Chest pain 04/05/2018   FHx: heart disease 04/05/2018   DDD (degenerative disc disease), cervical 12/24/2015   Classic migraine 05/09/2014   Myofascial pain syndrome 01/21/2014   Migraine without aura 07/27/2011    PCP: Maggie Font, FNP  REFERRING PROVIDER: Silverio Lay, MD  REFERRING DIAG: N81.89 (ICD-10-CM) - Other female genital prolapse  THERAPY DIAG:  Abnormal posture - Plan: PT plan of care cert/re-cert  Unspecified lack of  coordination - Plan: PT plan of care cert/re-cert  Muscle weakness (generalized) - Plan: PT plan of care cert/re-cert  Other muscle spasm - Plan: PT plan of care cert/re-cert  Rationale for Evaluation and Treatment: Rehabilitation  ONSET DATE: 2014  SUBJECTIVE:                                                                                                                                                                                           SUBJECTIVE STATEMENT: Pt has total hysterectomy on 10/28/2022 with anterior/posterior repair. She had several abnormal paps and fibroids that led to hysterectomy. She was having issues with voiding urine and bowel movements. No issues with leaking prior to surgery. She started noticing issues after breast reconstruction/revision and abdominoplasty in 2014. She states that she is now voiding very well with urination and bowel movements. Occasional low back pain. Fluid intake: Yes: 8 16oz bottles a day    PAIN:  Are you having pain? No   PRECAUTIONS: None  RED FLAGS: None   WEIGHT BEARING RESTRICTIONS: No  FALLS:  Has patient fallen in last 6 months? No  LIVING ENVIRONMENT: Lives with: lives with their family Lives in: House/apartment   OCCUPATION: retired  PLOF: Independent  PATIENT GOALS: to get stronger, return to working out, learn appropriate exercises  PERTINENT HISTORY:  Abdominoplasty 2014, anterior/posterior repair 2024, breast enhancement surgery, c-section, lumbar disc surgery, reduction mammaplasty 2014, hysterectomy 2024, tubal ligation, umbilical hernia repair, IBS, DM II  BOWEL MOVEMENT: Pain with bowel movement: No Type of bowel movement:Frequency 1x/wk and Strain No Fully empty rectum: Yes: - Leakage: No Pads: No Fiber supplement: No  URINATION: Pain with urination: No Fully empty bladder: Yes: - Stream: Strong Urgency: No Frequency: WNL Leakage:  none Pads: No  INTERCOURSE: Pain with intercourse:  Initial Penetration, During Penetration, and After Intercourse Ability to have vaginal penetration:  Yes: but never felt like she can get full insertion,  cannot have penetration right now; is working with dilators; not using lubricant Climax: very difficult and different from what it used to be [employer] Scale: 2/3  PREGNANCY: Vaginal deliveries 2 Tearing Yes: both C-section deliveries 1 Currently pregnant No  PROLAPSE: Just repaired, no current symptoms    OBJECTIVE:  Note: Objective measures were completed at Evaluation unless otherwise noted. 02/11/23: COGNITION: Overall cognitive status: Within functional limits for tasks assessed     SENSATION: Light touch: Appears intact   FUNCTIONAL TESTS:  Squat: WNL Single leg stance:  [id number]  Lt: Rt pelvic drop Curl-up test: mild abdominal distortion, 2 finger width separation   GAIT: Comments: WNL  POSTURE: rounded shoulders, forward head, decreased lumbar lordosis, increased thoracic kyphosis, and posterior pelvic tilt  PELVIC ALIGNMENT: Rt posterior rotation  LUMBARAROM/PROM:  A/PROM A/PROM  Eval (% available)  Flexion 75  Extension 50  Right lateral flexion 75  Left lateral flexion 75  Right rotation 25  Left rotation 25   (Blank rows = not tested)  PALPATION:   General  abdominal scar tissue and restriction                External Perineal Exam dryness, some redness in vestibule                             Internal Pelvic Floor burning throughout, increased pain and tenderness in deep pelvic floor muscle Lt>Rt  Patient confirms identification and approves PT to assess internal pelvic floor and treatment Yes  PELVIC MMT:   MMT eval  Vaginal 2/5, 4 second endurance, 5 repeat contractions  Diastasis Recti 2 finger width separation, very mild distortion   (Blank rows = not tested)        TONE: High, Lt>Rt  PROLAPSE: WNL  TODAY'S TREATMENT:                                                                                                                               DATE:  02/11/23  EVAL  Neuromuscular re-education: Diaphragmatic breathing Cat cow Child's pose Butterfly Happy baby Therapeutic activities: Lubricants Vulvovaginal massage    PATIENT EDUCATION:  Education details: See above Person educated: Patient Education method: Explanation, Demonstration, Tactile cues, Verbal cues, and Handouts Education comprehension: verbalized understanding  HOME EXERCISE PROGRAM: [id number]  ASSESSMENT:  CLINICAL IMPRESSION: Patient is a 53 y.o. female who was seen today for physical therapy evaluation and treatment for pelvic floor muscle and abdominal strengthening and assistance returning to working out. Exam findings notable for decreased lumbar A/ROM, functional core weakness in single leg stance, abnormal posture, abdominal scar tissue restriction, core weakness, pelvic floor muscle weakness and decreased endurance, mild impairments in pelvic floor muscle coordination, increased pelvic floor muscle tension (Lt>Rt)vaginal dryness, and tenderness/report of burning throughout the superficial and deep pelvic floor muscles. Signs and symptoms are most consistent with pelvic floor muscle weakness, decreased coordination/tension, and lack  of hormones after total hysterectomy. Initial treatment consisted of diaphragmatic breathing, down training/mobility exercises, vulvovaginal massage, and education on lubricants. She will continue to benefit from skilled PT intervention in order to improve pelvic floor muscle strength, decrease abnormal pelvic floor muscle tension, decrease abdominal scar tissue restriction, and begin/progress functional strengthening program.    OBJECTIVE IMPAIRMENTS: decreased activity tolerance, decreased coordination, decreased endurance, decreased strength, increased fascial restrictions, increased muscle spasms, impaired tone, postural dysfunction, and pain.   ACTIVITY  LIMITATIONS: lifting, bending, and squatting  PARTICIPATION LIMITATIONS:  working out  PERSONAL FACTORS: 1 comorbidity: medical history  are also affecting patient's functional outcome.   REHAB POTENTIAL: Good  CLINICAL DECISION MAKING: Stable/uncomplicated  EVALUATION COMPLEXITY: Low   GOALS: Goals reviewed with patient? Yes  SHORT TERM GOALS: Target date: 03/11/23  Pt will be independent with HEP.   Baseline: Goal status: INITIAL  2.  Pt will be independent with diaphragmatic breathing and down training activities in order to improve pelvic floor relaxation.  Baseline:  Goal status: INITIAL  3.  Pt will be able to correctly perform diaphragmatic breathing and appropriate pressure management in order to prevent worsening vaginal wall laxity and improve pelvic floor A/ROM.   Baseline:  Goal status: INITIAL  4.  Pt will increase pelvic floor muscle strength and coordination to 3/5 with appropriate relaxation.  Baseline:  Goal status: INITIAL  5.  Pt will be independent with vulvovaginal massage and perform on a daily basis to improve vulvar/vaginal comfort and decrease burning sensation.  Baseline:  Goal status: INITIAL   LONG TERM GOALS: Target date: 07/29/23  Pt will be independent with advanced HEP.   Baseline:  Goal status: INITIAL  2.  Pt will report 0/10 pain with vaginal penetration in order to improve intimate relationship with partner.    Baseline:  Goal status: INITIAL  3.  Pt will increase pelvic floor muscle strength to 4/5 with appropriate relaxation. Baseline:  Goal status: INITIAL  4.  Pt will increase pelvic floor muscle endurance to >10 seconds. Baseline:  Goal status: INITIAL  5.  Pt will be able to squat >20 lbs with appropriate pressure management and core facilitation.  Baseline:  Goal status: INITIAL    PLAN:  PT FREQUENCY: 1-2x/week  PT DURATION: 6 months  PLANNED INTERVENTIONS: 97110-Therapeutic exercises, 97530-  Therapeutic activity, 97112- Neuromuscular re-education, 97535- Self Care, [id number]- Manual therapy, Dry Needling, and Biofeedback  PLAN FOR NEXT SESSION: Begin core strengthening; mobility exercises; abdominal scar tissue mobilization.   Julio Alm, PT, DPT01/17/2511:36 AM

## 2023-02-14 ENCOUNTER — Ambulatory Visit: Payer: 59

## 2023-02-14 DIAGNOSIS — M62838 Other muscle spasm: Secondary | ICD-10-CM

## 2023-02-14 DIAGNOSIS — M6281 Muscle weakness (generalized): Secondary | ICD-10-CM

## 2023-02-14 DIAGNOSIS — R293 Abnormal posture: Secondary | ICD-10-CM | POA: Diagnosis not present

## 2023-02-14 DIAGNOSIS — R279 Unspecified lack of coordination: Secondary | ICD-10-CM

## 2023-02-14 NOTE — Therapy (Signed)
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   PONV (postoperative nausea and vomiting)    Seizures (HCC)    hx of seizures after concussion in 2013, no seizures since 2019, no longer on meds   Umbilical hernia    Vitamin D deficiency    Wears glasses    for distance vision only   Past Surgical History:  Procedure Laterality Date   ABDOMINOPLASTY  2014   ANTERIOR AND POSTERIOR REPAIR N/A 10/28/2022   Procedure:  ANTERIOR (CYSTOCELE) AND POSTERIOR REPAIR (RECTOCELE);  Surgeon: Silverio Lay, MD;  Location: [facility];  Service: Gynecology;  Laterality: N/A;   ARTHROSCOPIC REPAIR ACL  2010, 2011   ACL Repair x 2   BREAST ENHANCEMENT SURGERY  1990   implants   CESAREAN SECTION  1990   CYSTOSCOPY  10/28/2022   Procedure: CYSTOSCOPY;  Surgeon: Silverio Lay, MD;  Location: [facility];  Service: Gynecology;;   LUMBAR DISC SURGERY     x5 surgeries per pt; no hardware   REDUCTION MAMMAPLASTY Bilateral 2014   ROBOTIC ASSISTED LAPAROSCOPIC HYSTERECTOMY AND SALPINGECTOMY N/A 10/28/2022   Procedure: XI ROBOTIC ASSISTED LAPAROSCOPIC HYSTERECTOMY AND SALPINGECTOMY;  Surgeon: Silverio Lay, MD;  Location: [facility];  Service: Gynecology;  Laterality: N/A;   SALPINGECTOMY Left 2000   TUBAL LIGATION  2003   UMBILICAL HERNIA REPAIR  03/24/2011   Procedure: HERNIA REPAIR UMBILICAL ADULT;  Surgeon: Clovis Pu. 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 10/28/2022   Pelvic relaxation due  to vaginal prolapse 10/28/2022   Abnormal uterine bleeding 10/28/2022   Controlled type 2 diabetes mellitus without complication, without long-term current use of insulin (HCC) 10/10/2019   History of left oophorectomy 08/15/2019   Enlarged thyroid 04/06/2018   Palpitations 04/05/2018   Chest pain 04/05/2018   FHx: heart disease 04/05/2018   DDD (degenerative disc disease), cervical 12/24/2015   Classic migraine 05/09/2014   Myofascial pain syndrome 01/21/2014   Migraine without aura 07/27/2011    PCP: Maggie Font, FNP  REFERRING PROVIDER: Silverio Lay, MD  REFERRING DIAG: N81.89 (ICD-10-CM) - Other female genital prolapse  THERAPY DIAG:  Abnormal posture  Unspecified lack of coordination  Muscle weakness  (generalized)  Other muscle spasm  Rationale for Evaluation and Treatment: Rehabilitation  ONSET DATE: 2014  SUBJECTIVE:                                                                                                                                                                                           SUBJECTIVE STATEMENT: Pt states that she was a little confused on her exercises. She has not started using coconut oil yet but wants to since she has uncomfortable dryness even with sitting.   PAIN:  Are you having pain? No   PRECAUTIONS: None  RED FLAGS: None   WEIGHT BEARING RESTRICTIONS: No  FALLS:  Has patient fallen in last 6 months? No  LIVING ENVIRONMENT: Lives with: lives with their family Lives in: House/apartment   OCCUPATION: retired  PLOF: Independent  PATIENT GOALS: to get stronger, return to working out, learn appropriate exercises  PERTINENT HISTORY:  Abdominoplasty 2014, anterior/posterior repair 2024, breast enhancement surgery, c-section, lumbar disc surgery, reduction mammaplasty 2014, hysterectomy 2024, tubal ligation, umbilical hernia repair, IBS, DM II  BOWEL MOVEMENT: Pain with bowel movement: No Type of bowel movement:Frequency 1x/wk and Strain No Fully empty rectum: Yes: - Leakage: No Pads: No Fiber supplement: No  URINATION: Pain with urination: No Fully empty bladder: Yes: - Stream: Strong Urgency: No Frequency: WNL Leakage:  none Pads: No  INTERCOURSE: Pain with intercourse: Initial Penetration, During Penetration, and After Intercourse Ability to have vaginal penetration:  Yes: but never felt like she can get full insertion, cannot have penetration right now; is working with dilators; not using lubricant Climax: very difficult and different from what it used to be [employer] Scale: 2/3  PREGNANCY: Vaginal deliveries 2 Tearing Yes: both C-section deliveries 1 Currently pregnant No  PROLAPSE: Just repaired, no current  symptoms    OBJECTIVE:  Note: Objective measures were completed at Evaluation unless otherwise noted. 02/11/23: COGNITION: Overall cognitive status: Within functional limits for tasks  assessed     SENSATION: Light touch: Appears intact   FUNCTIONAL TESTS:  Squat: WNL Single leg stance:  [id number]  Lt: Rt pelvic drop Curl-up test: mild abdominal distortion, 2 finger width separation   GAIT: Comments: WNL  POSTURE: rounded shoulders, forward head, decreased lumbar lordosis, increased thoracic kyphosis, and posterior pelvic tilt  PELVIC ALIGNMENT: Rt posterior rotation  LUMBARAROM/PROM:  A/PROM A/PROM  Eval (% available)  Flexion 75  Extension 50  Right lateral flexion 75  Left lateral flexion 75  Right rotation 25  Left rotation 25   (Blank rows = not tested)  PALPATION:   General  abdominal scar tissue and restriction                External Perineal Exam dryness, some redness in vestibule                             Internal Pelvic Floor burning throughout, increased pain and tenderness in deep pelvic floor muscle Lt>Rt  Patient confirms identification and approves PT to assess internal pelvic floor and treatment Yes  PELVIC MMT:   MMT eval  Vaginal 2/5, 4 second endurance, 5 repeat contractions  Diastasis Recti 2 finger width separation, very mild distortion   (Blank rows = not tested)        TONE: High, Lt>Rt  PROLAPSE: WNL  TODAY'S TREATMENT:                                                                                                                              DATE:  02/14/23 Manual: Abdominal scar tissue mobilization Negative pressure soft tissue mobilization to lower bil abdomen  Neuromuscular re-education: Diaphragmatic breathing  Cat cow 2 x 10 Child's pose 10 breaths Butterfly 10 breaths Happy baby 10 breaths  Exercises: Lower trunk rotation 2 x 10 Open books 10x bil  02/11/23  EVAL  Neuromuscular re-education: Diaphragmatic  breathing Cat cow Child's pose Butterfly Happy baby Therapeutic activities: Lubricants Vulvovaginal massage    PATIENT EDUCATION:  Education details: See above Person educated: Patient Education method: Explanation, Demonstration, Tactile cues, Verbal cues, and Handouts Education comprehension: verbalized understanding  HOME EXERCISE PROGRAM: [id number]  ASSESSMENT:  CLINICAL IMPRESSION: Due to pt being uncertain if she is doing exercises correctly, we went over all initial down training with multimodal cues for improved abdominal and pelvic floor muscle relaxation. We also performed abdominal scar tissue mobilization to help improve restriction that is impacting pelvic floor muscle relaxation and causing her some soreness. She did very well with these manual techniques and reported less soreness afterwards; she has significant restriction in bil obliques and Lt side of tummy tuck incision. Some good improvement in restriction throughout session. She able to progress mobility exercises to include lower trunk rotation and open books with good form. She will continue to benefit from skilled PT intervention in order to improve pelvic floor muscle strength, decrease  abnormal pelvic floor muscle tension, decrease abdominal scar tissue restriction, and begin/progress functional strengthening program.    OBJECTIVE IMPAIRMENTS: decreased activity tolerance, decreased coordination, decreased endurance, decreased strength, increased fascial restrictions, increased muscle spasms, impaired tone, postural dysfunction, and pain.   ACTIVITY LIMITATIONS: lifting, bending, and squatting  PARTICIPATION LIMITATIONS:  working out  PERSONAL FACTORS: 1 comorbidity: medical history  are also affecting patient's functional outcome.   REHAB POTENTIAL: Good  CLINICAL DECISION MAKING: Stable/uncomplicated  EVALUATION COMPLEXITY: Low   GOALS: Goals reviewed with patient? Yes  SHORT TERM GOALS: Target  date: 03/11/23  Pt will be independent with HEP.   Baseline: Goal status: INITIAL  2.  Pt will be independent with diaphragmatic breathing and down training activities in order to improve pelvic floor relaxation.  Baseline:  Goal status: INITIAL  3.  Pt will be able to correctly perform diaphragmatic breathing and appropriate pressure management in order to prevent worsening vaginal wall laxity and improve pelvic floor A/ROM.   Baseline:  Goal status: INITIAL  4.  Pt will increase pelvic floor muscle strength and coordination to 3/5 with appropriate relaxation.  Baseline:  Goal status: INITIAL  5.  Pt will be independent with vulvovaginal massage and perform on a daily basis to improve vulvar/vaginal comfort and decrease burning sensation.  Baseline:  Goal status: INITIAL   LONG TERM GOALS: Target date: 07/29/23  Pt will be independent with advanced HEP.   Baseline:  Goal status: INITIAL  2.  Pt will report 0/10 pain with vaginal penetration in order to improve intimate relationship with partner.    Baseline:  Goal status: INITIAL  3.  Pt will increase pelvic floor muscle strength to 4/5 with appropriate relaxation. Baseline:  Goal status: INITIAL  4.  Pt will increase pelvic floor muscle endurance to >10 seconds. Baseline:  Goal status: INITIAL  5.  Pt will be able to squat >20 lbs with appropriate pressure management and core facilitation.  Baseline:  Goal status: INITIAL    PLAN:  PT FREQUENCY: 1-2x/week  PT DURATION: 6 months  PLANNED INTERVENTIONS: 97110-Therapeutic exercises, 97530- Therapeutic activity, 97112- Neuromuscular re-education, 97535- Self Care, [id number]- Manual therapy, Dry Needling, and Biofeedback  PLAN FOR NEXT SESSION: Begin core strengthening; mobility exercises; abdominal scar tissue mobilization.   Julio Alm, PT, DPT01/20/2510:08 AM

## 2023-02-21 ENCOUNTER — Ambulatory Visit: Payer: 59

## 2023-02-21 DIAGNOSIS — R293 Abnormal posture: Secondary | ICD-10-CM | POA: Diagnosis not present

## 2023-02-21 DIAGNOSIS — M62838 Other muscle spasm: Secondary | ICD-10-CM

## 2023-02-21 DIAGNOSIS — M6281 Muscle weakness (generalized): Secondary | ICD-10-CM

## 2023-02-21 DIAGNOSIS — R279 Unspecified lack of coordination: Secondary | ICD-10-CM

## 2023-02-21 NOTE — Therapy (Signed)
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   PONV (postoperative nausea and vomiting)    Seizures (HCC)    hx of seizures after concussion in 2013, no seizures since 2019, no longer on meds   Umbilical hernia    Vitamin D deficiency    Wears glasses    for distance vision only   Past Surgical History:  Procedure Laterality Date   ABDOMINOPLASTY  2014   ANTERIOR AND POSTERIOR REPAIR N/A 10/28/2022   Procedure:  ANTERIOR (CYSTOCELE) AND POSTERIOR REPAIR (RECTOCELE);  Surgeon: Silverio Lay, MD;  Location: [facility];  Service: Gynecology;  Laterality: N/A;   ARTHROSCOPIC REPAIR ACL  2010, 2011   ACL Repair x 2   BREAST ENHANCEMENT SURGERY  1990   implants   CESAREAN SECTION  1990   CYSTOSCOPY  10/28/2022   Procedure: CYSTOSCOPY;  Surgeon: Silverio Lay, MD;  Location: [facility];  Service: Gynecology;;   LUMBAR DISC SURGERY     x5 surgeries per pt; no hardware   REDUCTION MAMMAPLASTY Bilateral 2014   ROBOTIC ASSISTED LAPAROSCOPIC HYSTERECTOMY AND SALPINGECTOMY N/A 10/28/2022   Procedure: XI ROBOTIC ASSISTED LAPAROSCOPIC HYSTERECTOMY AND SALPINGECTOMY;  Surgeon: Silverio Lay, MD;  Location: [facility];  Service: Gynecology;  Laterality: N/A;   SALPINGECTOMY Left 2000   TUBAL LIGATION  2003   UMBILICAL HERNIA REPAIR  03/24/2011   Procedure: HERNIA REPAIR UMBILICAL ADULT;  Surgeon: Clovis Pu. 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 10/28/2022   Pelvic relaxation due  to vaginal prolapse 10/28/2022   Abnormal uterine bleeding 10/28/2022   Controlled type 2 diabetes mellitus without complication, without long-term current use of insulin (HCC) 10/10/2019   History of left oophorectomy 08/15/2019   Enlarged thyroid 04/06/2018   Palpitations 04/05/2018   Chest pain 04/05/2018   FHx: heart disease 04/05/2018   DDD (degenerative disc disease), cervical 12/24/2015   Classic migraine 05/09/2014   Myofascial pain syndrome 01/21/2014   Migraine without aura 07/27/2011    PCP: Maggie Font, FNP  REFERRING PROVIDER: Silverio Lay, MD  REFERRING DIAG: N81.89 (ICD-10-CM) - Other female genital prolapse  THERAPY DIAG:  Abnormal posture  Unspecified lack of coordination  Muscle weakness  (generalized)  Other muscle spasm  Rationale for Evaluation and Treatment: Rehabilitation  ONSET DATE: 2014  SUBJECTIVE:                                                                                                                                                                                           SUBJECTIVE STATEMENT:   PAIN:  Are you having pain? No   PRECAUTIONS: None  RED FLAGS: None   WEIGHT BEARING RESTRICTIONS: No  FALLS:  Has patient fallen in last 6 months? No  LIVING ENVIRONMENT: Lives with: lives with their family Lives in: House/apartment   OCCUPATION: retired  PLOF: Independent  PATIENT GOALS: to get stronger, return to working out, learn appropriate exercises  PERTINENT HISTORY:  Abdominoplasty 2014, anterior/posterior repair 2024, breast enhancement surgery, c-section, lumbar disc surgery, reduction mammaplasty 2014, hysterectomy 2024, tubal ligation, umbilical hernia repair, IBS, DM II  BOWEL MOVEMENT: Pain with bowel movement: No Type of bowel movement:Frequency 1x/wk and Strain No Fully empty rectum: Yes: - Leakage: No Pads: No Fiber supplement: No  URINATION: Pain with urination: No Fully empty bladder: Yes: - Stream: Strong Urgency: No Frequency: WNL Leakage:  none Pads: No  INTERCOURSE: Pain with intercourse: Initial Penetration, During Penetration, and After Intercourse Ability to have vaginal penetration:  Yes: but never felt like she can get full insertion, cannot have penetration right now; is working with dilators; not using lubricant Climax: very difficult and different from what it used to be [employer] Scale: 2/3  PREGNANCY: Vaginal deliveries 2 Tearing Yes: both C-section deliveries 1 Currently pregnant No  PROLAPSE: Just repaired, no current symptoms    OBJECTIVE:  Note: Objective measures were completed at Evaluation unless otherwise noted. 02/11/23: COGNITION: Overall cognitive status: Within  functional limits for tasks assessed     SENSATION: Light touch: Appears intact   FUNCTIONAL TESTS:  Squat: WNL Single leg stance:  [id number]  Lt: Rt pelvic drop Curl-up test: mild  abdominal distortion, 2 finger width separation   GAIT: Comments: WNL  POSTURE: rounded shoulders, forward head, decreased lumbar lordosis, increased thoracic kyphosis, and posterior pelvic tilt  PELVIC ALIGNMENT: Rt posterior rotation  LUMBARAROM/PROM:  A/PROM A/PROM  Eval (% available)  Flexion 75  Extension 50  Right lateral flexion 75  Left lateral flexion 75  Right rotation 25  Left rotation 25   (Blank rows = not tested)  PALPATION:   General  abdominal scar tissue and restriction                External Perineal Exam dryness, some redness in vestibule                             Internal Pelvic Floor burning throughout, increased pain and tenderness in deep pelvic floor muscle Lt>Rt  Patient confirms identification and approves PT to assess internal pelvic floor and treatment Yes  PELVIC MMT:   MMT eval  Vaginal 2/5, 4 second endurance, 5 repeat contractions  Diastasis Recti 2 finger width separation, very mild distortion   (Blank rows = not tested)        TONE: High, Lt>Rt  PROLAPSE: WNL  TODAY'S TREATMENT:                                                                                                                              DATE:  02/21/23 Neuromuscular re-education: Transversus abdominus training with multimodal cues for improved motor control and breath coordination Transversus abdominus isometrics 10x Bil supine UE ball press with transversus abdominus and pelvic floor muscle contractions and breath coordination 10x Supine hip adduction ball press with transversus abdominus and pelvic floor muscle contractions and breath coordination 10x Supine dynadisc posterior pelvic tilts 2 x 10 Supine dynadisc march 2 x 10 Supine march 2 x 10 Seated hip adduction ball  press with transversus abdominus and pelvic floor muscle 2 x 10 Seated hip abduction red band with transversus abdominus and pelvic floor muscle 2 x 10 Seated resisted march red band with transversus abdominus and pelvic floor muscle 2 x 10  02/14/23 Manual: Abdominal scar tissue mobilization Negative pressure soft tissue mobilization to lower bil abdomen  Neuromuscular re-education: Diaphragmatic breathing  Cat cow 2 x 10 Child's pose 10 breaths Butterfly 10 breaths Happy baby 10 breaths  Exercises: Lower trunk rotation 2 x 10 Open books 10x bil  02/11/23  EVAL  Neuromuscular re-education: Diaphragmatic breathing Cat cow Child's pose Butterfly Happy baby Therapeutic activities: Lubricants Vulvovaginal massage    PATIENT EDUCATION:  Education details: See above Person educated: Patient Education method: Explanation, Demonstration, Tactile cues, Verbal cues, and Handouts Education comprehension: verbalized understanding  HOME EXERCISE PROGRAM: [id number]  ASSESSMENT:  CLINICAL IMPRESSION: Pt doing very well overall. She has been working on exercises. Core training started this session. She had some difficulty with good core activation, demonstrating good coordination but a lot of weakness.  Several times she did use pressure to get stronger contraction, but was able to correct. She was able to progress exercises with good tolerance. She will continue to benefit from skilled PT intervention in order to improve pelvic floor muscle strength, decrease abnormal pelvic floor muscle tension, decrease abdominal scar tissue restriction, and begin/progress functional strengthening program.    OBJECTIVE IMPAIRMENTS: decreased activity tolerance, decreased coordination, decreased endurance, decreased strength, increased fascial restrictions, increased muscle spasms, impaired tone, postural dysfunction, and pain.   ACTIVITY LIMITATIONS: lifting, bending, and squatting  PARTICIPATION  LIMITATIONS:  working out  PERSONAL FACTORS: 1 comorbidity: medical history  are also affecting patient's functional outcome.   REHAB POTENTIAL: Good  CLINICAL DECISION MAKING: Stable/uncomplicated  EVALUATION COMPLEXITY: Low   GOALS: Goals reviewed with patient? Yes  SHORT TERM GOALS: Target date: 03/11/23  Pt will be independent with HEP.   Baseline: Goal status: INITIAL  2.  Pt will be independent with diaphragmatic breathing and down training activities in order to improve pelvic floor relaxation.  Baseline:  Goal status: INITIAL  3.  Pt will be able to correctly perform diaphragmatic breathing and appropriate pressure management in order to prevent worsening vaginal wall laxity and improve pelvic floor A/ROM.   Baseline:  Goal status: INITIAL  4.  Pt will increase pelvic floor muscle strength and coordination to 3/5 with appropriate relaxation.  Baseline:  Goal status: INITIAL  5.  Pt will be independent with vulvovaginal massage and perform on a daily basis to improve vulvar/vaginal comfort and decrease burning sensation.  Baseline:  Goal status: INITIAL   LONG TERM GOALS: Target date: 07/29/23  Pt will be independent with advanced HEP.   Baseline:  Goal status: INITIAL  2.  Pt will report 0/10 pain with vaginal penetration in order to improve intimate relationship with partner.    Baseline:  Goal status: INITIAL  3.  Pt will increase pelvic floor muscle strength to 4/5 with appropriate relaxation. Baseline:  Goal status: INITIAL  4.  Pt will increase pelvic floor muscle endurance to >10 seconds. Baseline:  Goal status: INITIAL  5.  Pt will be able to squat >20 lbs with appropriate pressure management and core facilitation.  Baseline:  Goal status: INITIAL    PLAN:  PT FREQUENCY: 1-2x/week  PT DURATION: 6 months  PLANNED INTERVENTIONS: 97110-Therapeutic exercises, 97530- Therapeutic activity, 97112- Neuromuscular re-education, 97535- Self  Care, 69629- Manual therapy, Dry Needling, and Biofeedback  PLAN FOR NEXT SESSION: Begin core strengthening; mobility exercises; abdominal scar tissue mobilization.   Julio Alm, PT, DPT01/27/2510:12 AM

## 2023-02-28 ENCOUNTER — Ambulatory Visit: Payer: 59 | Attending: Obstetrics and Gynecology

## 2023-02-28 DIAGNOSIS — R279 Unspecified lack of coordination: Secondary | ICD-10-CM | POA: Diagnosis present

## 2023-02-28 DIAGNOSIS — M6281 Muscle weakness (generalized): Secondary | ICD-10-CM | POA: Diagnosis present

## 2023-02-28 DIAGNOSIS — M62838 Other muscle spasm: Secondary | ICD-10-CM | POA: Diagnosis present

## 2023-02-28 DIAGNOSIS — R293 Abnormal posture: Secondary | ICD-10-CM | POA: Insufficient documentation

## 2023-02-28 NOTE — Therapy (Signed)
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   PONV (postoperative nausea and vomiting)    Seizures (HCC)    hx of seizures after concussion in 2013, no seizures since 2019, no longer on meds   Umbilical hernia    Vitamin D deficiency    Wears glasses    for distance vision only   Past Surgical History:  Procedure Laterality Date   ABDOMINOPLASTY  2014   ANTERIOR AND POSTERIOR REPAIR N/A 10/28/2022   Procedure:  ANTERIOR (CYSTOCELE) AND POSTERIOR REPAIR (RECTOCELE);  Surgeon: Silverio Lay, MD;  Location: [facility];  Service: Gynecology;  Laterality: N/A;   ARTHROSCOPIC REPAIR ACL  2010, 2011   ACL Repair x 2   BREAST ENHANCEMENT SURGERY  1990   implants   CESAREAN SECTION  1990   CYSTOSCOPY  10/28/2022   Procedure: CYSTOSCOPY;  Surgeon: Silverio Lay, MD;  Location: [facility];  Service: Gynecology;;   LUMBAR DISC SURGERY     x5 surgeries per pt; no hardware   REDUCTION MAMMAPLASTY Bilateral 2014   ROBOTIC ASSISTED LAPAROSCOPIC HYSTERECTOMY AND SALPINGECTOMY N/A 10/28/2022   Procedure: XI ROBOTIC ASSISTED LAPAROSCOPIC HYSTERECTOMY AND SALPINGECTOMY;  Surgeon: Silverio Lay, MD;  Location: [facility];  Service: Gynecology;  Laterality: N/A;   SALPINGECTOMY Left 2000   TUBAL LIGATION  2003   UMBILICAL HERNIA REPAIR  03/24/2011   Procedure: HERNIA REPAIR UMBILICAL ADULT;  Surgeon: Clovis Pu. Cornett, MD;  Location: [facility];  Service: General;  Laterality: N/A;  umbilicus   Patient Active Problem List   Diagnosis Date Noted   Pelvic relaxation due to uterine prolapse 10/28/2022   Pelvic relaxation due  to vaginal prolapse 10/28/2022   Abnormal uterine bleeding 10/28/2022   Controlled type 2 diabetes mellitus without complication, without long-term current use of insulin (HCC) 10/10/2019   History of left oophorectomy 08/15/2019   Enlarged thyroid 04/06/2018   Palpitations 04/05/2018   Chest pain 04/05/2018   FHx: heart disease 04/05/2018   DDD (degenerative disc disease), cervical 12/24/2015   Classic migraine 05/09/2014   Myofascial pain syndrome 01/21/2014   Migraine without aura 07/27/2011    PCP: Maggie Font, FNP  REFERRING PROVIDER: Silverio Lay, MD  REFERRING DIAG: N81.89 (ICD-10-CM) - Other female genital prolapse  THERAPY DIAG:  Abnormal posture  Unspecified lack of coordination  Muscle weakness  (generalized)  Other muscle spasm  Rationale for Evaluation and Treatment: Rehabilitation  ONSET DATE: 2014  SUBJECTIVE:                                                                                                                                                                                           SUBJECTIVE STATEMENT: Pt states that she feels like her low back and abdomen are feeling better. She states that she would get random pains in low back that she is not feeling as much. She believes that this may have been coming from her pelvic floor.  PAIN:  Are you having pain? No   PRECAUTIONS: None  RED FLAGS: None   WEIGHT BEARING RESTRICTIONS: No  FALLS:  Has patient fallen in last 6 months? No  LIVING ENVIRONMENT: Lives with: lives with their family Lives in: House/apartment   OCCUPATION: retired  PLOF: Independent  PATIENT GOALS: to get stronger, return to working out, learn appropriate exercises  PERTINENT HISTORY:  Abdominoplasty 2014, anterior/posterior repair 2024, breast enhancement surgery, c-section, lumbar disc surgery, reduction mammaplasty 2014, hysterectomy 2024, tubal ligation, umbilical hernia repair, IBS, DM II  BOWEL MOVEMENT: Pain with bowel movement: No Type of bowel movement:Frequency 1x/wk and Strain No Fully empty rectum: Yes: - Leakage: No Pads: No Fiber supplement: No  URINATION: Pain with urination: No Fully empty bladder: Yes: - Stream: Strong Urgency: No Frequency: WNL Leakage:  none Pads: No  INTERCOURSE: Pain with intercourse: Initial Penetration, During Penetration, and After Intercourse Ability to have vaginal penetration:  Yes: but never felt like she can get full insertion, cannot have penetration right now; is working with dilators; not using lubricant Climax: very difficult and different from what it used to be [employer] Scale: 2/3  PREGNANCY: Vaginal deliveries 2 Tearing Yes: both C-section deliveries  1 Currently pregnant No  PROLAPSE: Just repaired, no current symptoms    OBJECTIVE:  Note: Objective measures were completed at Evaluation  unless otherwise noted. 02/11/23: COGNITION: Overall cognitive status: Within functional limits for tasks assessed     SENSATION: Light touch: Appears intact   FUNCTIONAL TESTS:  Squat: WNL Single leg stance:  [id number]  Lt: Rt pelvic drop Curl-up test: mild abdominal distortion, 2 finger width separation   GAIT: Comments: WNL  POSTURE: rounded shoulders, forward head, decreased lumbar lordosis, increased thoracic kyphosis, and posterior pelvic tilt  PELVIC ALIGNMENT: Rt posterior rotation  LUMBARAROM/PROM:  A/PROM A/PROM  Eval (% available)  Flexion 75  Extension 50  Right lateral flexion 75  Left lateral flexion 75  Right rotation 25  Left rotation 25   (Blank rows = not tested)  PALPATION:   General  abdominal scar tissue and restriction                External Perineal Exam dryness, some redness in vestibule                             Internal Pelvic Floor burning throughout, increased pain and tenderness in deep pelvic floor muscle Lt>Rt  Patient confirms identification and approves PT to assess internal pelvic floor and treatment Yes  PELVIC MMT:   MMT eval  Vaginal 2/5, 4 second endurance, 5 repeat contractions  Diastasis Recti 2 finger width separation, very mild distortion   (Blank rows = not tested)        TONE: High, Lt>Rt  PROLAPSE: WNL  TODAY'S TREATMENT:                                                                                                                              DATE:  02/28/23 Neuromuscular re-education: Bridge with hip adduction, transversus abdominus, and pelvic floor muscle 10x Supine posterior pelvic tilts on dynadisc 2 x 10 Supine march on dynadisc 2 x 10  Bridge with march 10x bil Exercises: Lower trunk rotation 2 x 10 Supine piriformis stretch 2 x 60 sec bil Therapeutic  activities: Side lying clam shell 2 x 10 bil Side lying reverse clam shell 2 x 10 bil Side lying UE horizontal abduction 2 x 10 bil 3lbs    02/21/23 Neuromuscular re-education: Transversus abdominus training with multimodal cues for improved motor control and breath coordination Transversus abdominus isometrics 10x Bil supine UE ball press with transversus abdominus and pelvic floor muscle contractions and breath coordination 10x Supine hip adduction ball press with transversus abdominus and pelvic floor muscle contractions and breath coordination 10x Supine dynadisc posterior pelvic tilts 2 x 10 Supine dynadisc march 2 x 10 Supine march 2 x 10 Seated hip adduction ball press with transversus abdominus and pelvic floor muscle 2 x 10 Seated hip abduction red band with transversus abdominus and pelvic floor muscle 2 x 10 Seated resisted march red band with transversus abdominus and pelvic floor muscle 2 x 10  02/14/23 Manual: Abdominal scar tissue mobilization Negative pressure soft tissue mobilization to lower  bil abdomen  Neuromuscular re-education: Diaphragmatic breathing  Cat cow 2 x 10 Child's pose 10 breaths Butterfly 10 breaths Happy baby 10 breaths  Exercises: Lower trunk rotation 2 x 10 Open books 10x bil  PATIENT EDUCATION:  Education details: See above Person educated: Patient Education method: Explanation, Demonstration, Tactile cues, Verbal cues, and Handouts Education comprehension: verbalized understanding  HOME EXERCISE PROGRAM: [id number]  ASSESSMENT:  CLINICAL IMPRESSION: Pt doing well and feeling stronger. She states that she feels like she has recognized that some of her low back pain was coming from pelvic floor now that she is learning to relax. She did well with strengthening progressions focusing on improving core motor control and coordination in progressively challenging activities. Some multimodal cues to help optimize core function. She will continue  to benefit from skilled PT intervention in order to improve pelvic floor muscle strength, decrease abnormal pelvic floor muscle tension, decrease abdominal scar tissue restriction, and begin/progress functional strengthening program.    OBJECTIVE IMPAIRMENTS: decreased activity tolerance, decreased coordination, decreased endurance, decreased strength, increased fascial restrictions, increased muscle spasms, impaired tone, postural dysfunction, and pain.   ACTIVITY LIMITATIONS: lifting, bending, and squatting  PARTICIPATION LIMITATIONS:  working out  PERSONAL FACTORS: 1 comorbidity: medical history  are also affecting patient's functional outcome.   REHAB POTENTIAL: Good  CLINICAL DECISION MAKING: Stable/uncomplicated  EVALUATION COMPLEXITY: Low   GOALS: Goals reviewed with patient? Yes  SHORT TERM GOALS: Target date: 03/11/23  Pt will be independent with HEP.   Baseline: Goal status: INITIAL  2.  Pt will be independent with diaphragmatic breathing and down training activities in order to improve pelvic floor relaxation.  Baseline:  Goal status: INITIAL  3.  Pt will be able to correctly perform diaphragmatic breathing and appropriate pressure management in order to prevent worsening vaginal wall laxity and improve pelvic floor A/ROM.   Baseline:  Goal status: INITIAL  4.  Pt will increase pelvic floor muscle strength and coordination to 3/5 with appropriate relaxation.  Baseline:  Goal status: INITIAL  5.  Pt will be independent with vulvovaginal massage and perform on a daily basis to improve vulvar/vaginal comfort and decrease burning sensation.  Baseline:  Goal status: INITIAL   LONG TERM GOALS: Target date: 07/29/23  Pt will be independent with advanced HEP.   Baseline:  Goal status: INITIAL  2.  Pt will report 0/10 pain with vaginal penetration in order to improve intimate relationship with partner.    Baseline:  Goal status: INITIAL  3.  Pt will increase  pelvic floor muscle strength to 4/5 with appropriate relaxation. Baseline:  Goal status: INITIAL  4.  Pt will increase pelvic floor muscle endurance to >10 seconds. Baseline:  Goal status: INITIAL  5.  Pt will be able to squat >20 lbs with appropriate pressure management and core facilitation.  Baseline:  Goal status: INITIAL    PLAN:  PT FREQUENCY: 1-2x/week  PT DURATION: 6 months  PLANNED INTERVENTIONS: 97110-Therapeutic exercises, 97530- Therapeutic activity, 97112- Neuromuscular re-education, 97535- Self Care, [id number]- Manual therapy, Dry Needling, and Biofeedback  PLAN FOR NEXT SESSION: Progress core strengthening; mobility exercises; abdominal scar tissue mobilization.   Julio Alm, PT, DPT02/03/2508:56 AM

## 2023-04-05 ENCOUNTER — Other Ambulatory Visit: Payer: Self-pay | Admitting: Obstetrics and Gynecology

## 2023-04-05 DIAGNOSIS — Z1231 Encounter for screening mammogram for malignant neoplasm of breast: Secondary | ICD-10-CM

## 2023-07-05 ENCOUNTER — Other Ambulatory Visit: Payer: Self-pay | Admitting: Obstetrics and Gynecology

## 2023-07-05 DIAGNOSIS — Z1231 Encounter for screening mammogram for malignant neoplasm of breast: Secondary | ICD-10-CM
